# Patient Record
Sex: Male | Born: 1935
Health system: Southern US, Community
[De-identification: ages and names within clinical notes are randomized; demographics above are authoritative.]

## PROBLEM LIST (undated history)

## (undated) DIAGNOSIS — I6529 Occlusion and stenosis of unspecified carotid artery: Secondary | ICD-10-CM

## (undated) DIAGNOSIS — Z8619 Personal history of other infectious and parasitic diseases: Secondary | ICD-10-CM

## (undated) DIAGNOSIS — I679 Cerebrovascular disease, unspecified: Secondary | ICD-10-CM

## (undated) DIAGNOSIS — R7301 Impaired fasting glucose: Secondary | ICD-10-CM

## (undated) DIAGNOSIS — I350 Nonrheumatic aortic (valve) stenosis: Secondary | ICD-10-CM

## (undated) DIAGNOSIS — M109 Gout, unspecified: Secondary | ICD-10-CM

## (undated) DIAGNOSIS — E785 Hyperlipidemia, unspecified: Secondary | ICD-10-CM

## (undated) DIAGNOSIS — I10 Essential (primary) hypertension: Secondary | ICD-10-CM

## (undated) HISTORY — DX: Personal history of other infectious and parasitic diseases: Z86.19

## (undated) HISTORY — DX: Gout, unspecified: M10.9

## (undated) HISTORY — PX: OTHER SURGICAL HISTORY: SHX169

## (undated) HISTORY — DX: Occlusion and stenosis of unspecified carotid artery: I65.29

## (undated) HISTORY — DX: Cerebrovascular disease, unspecified: I67.9

## (undated) HISTORY — DX: Impaired fasting glucose: R73.01

## (undated) HISTORY — DX: Essential (primary) hypertension: I10

## (undated) HISTORY — DX: Nonrheumatic aortic (valve) stenosis: I35.0

## (undated) HISTORY — DX: Hyperlipidemia, unspecified: E78.5

---

## 1998-01-12 ENCOUNTER — Other Ambulatory Visit: Admission: RE | Admit: 1998-01-12 | Discharge: 1998-01-12 | Payer: Self-pay | Admitting: Cardiology

## 1998-01-26 ENCOUNTER — Ambulatory Visit (HOSPITAL_COMMUNITY): Admission: RE | Admit: 1998-01-26 | Discharge: 1998-01-26 | Payer: Self-pay | Admitting: Cardiology

## 1999-10-18 ENCOUNTER — Ambulatory Visit (HOSPITAL_COMMUNITY): Admission: RE | Admit: 1999-10-18 | Discharge: 1999-10-18 | Payer: Self-pay | Admitting: Cardiology

## 2002-01-24 ENCOUNTER — Encounter: Payer: Self-pay | Admitting: *Deleted

## 2002-01-28 ENCOUNTER — Inpatient Hospital Stay (HOSPITAL_COMMUNITY): Admission: RE | Admit: 2002-01-28 | Discharge: 2002-01-29 | Payer: Self-pay | Admitting: *Deleted

## 2002-01-28 ENCOUNTER — Encounter (INDEPENDENT_AMBULATORY_CARE_PROVIDER_SITE_OTHER): Payer: Self-pay | Admitting: *Deleted

## 2007-10-01 ENCOUNTER — Ambulatory Visit: Payer: Self-pay | Admitting: Vascular Surgery

## 2008-01-27 ENCOUNTER — Encounter: Admission: RE | Admit: 2008-01-27 | Discharge: 2008-01-27 | Payer: Self-pay | Admitting: Internal Medicine

## 2009-01-27 ENCOUNTER — Encounter: Admission: RE | Admit: 2009-01-27 | Discharge: 2009-01-27 | Payer: Self-pay | Admitting: Internal Medicine

## 2009-02-11 ENCOUNTER — Encounter: Payer: Self-pay | Admitting: Internal Medicine

## 2010-07-19 ENCOUNTER — Ambulatory Visit: Payer: Self-pay | Admitting: Cardiology

## 2010-09-07 ENCOUNTER — Other Ambulatory Visit: Payer: Self-pay | Admitting: *Deleted

## 2010-09-07 DIAGNOSIS — E785 Hyperlipidemia, unspecified: Secondary | ICD-10-CM

## 2010-09-07 MED ORDER — SIMVASTATIN 20 MG PO TABS: 20.0000 mg | ORAL_TABLET | Freq: Every evening | ORAL | Status: DC

## 2010-10-10 ENCOUNTER — Other Ambulatory Visit: Payer: Self-pay | Admitting: *Deleted

## 2010-10-10 DIAGNOSIS — E7800 Pure hypercholesterolemia, unspecified: Secondary | ICD-10-CM

## 2010-10-18 ENCOUNTER — Other Ambulatory Visit: Payer: Self-pay | Admitting: *Deleted

## 2010-10-27 ENCOUNTER — Other Ambulatory Visit (INDEPENDENT_AMBULATORY_CARE_PROVIDER_SITE_OTHER): Payer: Medicare Other | Admitting: *Deleted

## 2010-10-27 ENCOUNTER — Other Ambulatory Visit (INDEPENDENT_AMBULATORY_CARE_PROVIDER_SITE_OTHER): Payer: Medicare Other | Admitting: Cardiology

## 2010-10-27 DIAGNOSIS — Z13220 Encounter for screening for lipoid disorders: Secondary | ICD-10-CM

## 2010-10-27 DIAGNOSIS — E7800 Pure hypercholesterolemia, unspecified: Secondary | ICD-10-CM

## 2010-10-27 LAB — LIPID PANEL: VLDL: 49.4 mg/dL — ABNORMAL HIGH (ref 0.0–40.0)

## 2010-10-27 LAB — BASIC METABOLIC PANEL
BUN: 16 mg/dL (ref 6–23)
Chloride: 104 mEq/L (ref 96–112)
GFR: 87.45 mL/min (ref >60.00)
Glucose, Bld: 101 mg/dL — ABNORMAL HIGH (ref 70–99)
Potassium: 4.1 mEq/L (ref 3.5–5.1)
Sodium: 141 mEq/L (ref 135–145)

## 2010-10-27 LAB — HEPATIC FUNCTION PANEL
ALT: 19 U/L (ref 0–53)
AST: 17 U/L (ref 0–37)
Alkaline Phosphatase: 79 U/L (ref 39–117)
Total Bilirubin: 0.6 mg/dL (ref 0.3–1.2)

## 2010-10-27 LAB — LDL CHOLESTEROL, DIRECT: Direct LDL: 129.6 mg/dL

## 2010-10-28 ENCOUNTER — Telehealth: Payer: Self-pay | Admitting: *Deleted

## 2010-10-28 NOTE — Telephone Encounter (Signed)
No answering machine available to leave a message regarding lab work

## 2010-11-02 ENCOUNTER — Telehealth: Payer: Self-pay | Admitting: *Deleted

## 2010-11-02 NOTE — Telephone Encounter (Signed)
Message copied by Barnetta Hammersmith on Wed Nov 02, 2010  8:01 AM ------      Message from: Roger Shelter      Created: Fri Oct 28, 2010  3:49 PM       Continue same meds, recheck labs in six months.

## 2010-11-03 NOTE — Telephone Encounter (Signed)
Labs reported to patient. He is inquiring about who he will follow-up with.  Informed pt we will call him back after discussing with Dr Deborah Chalk

## 2010-11-04 ENCOUNTER — Telehealth: Payer: Self-pay | Admitting: *Deleted

## 2010-11-04 NOTE — Op Note (Signed)
NAMEJAYON, [name] NO.:  [id number]   MEDICAL RECORD NO.:  [id number]                   PATIENT TYPE:  INP   LOCATION:  2899                                 FACILITY:  [facility]   PHYSICIAN:  Balinda Quails, M.D.                 DATE OF BIRTH:  Oct 24, 1928   DATE OF PROCEDURE:  01/28/2002  DATE OF DISCHARGE:                                 OPERATIVE REPORT   SURGEON:  Balinda Quails, M.D.   ASSISTANT:  Ines Bloomer, M.D.  Gina L. Thomasena Edis, P.A.   ANESTHETIC:  General endotracheal.   ANESTHESIOLOGIST:  Cliffton Asters. Ivin Booty, M.D.   PREOPERATIVE DIAGNOSIS:  Severe progressive right internal carotid artery  stenosis.   POSTOPERATIVE DIAGNOSES:  1. Severe progressive right internal carotid artery stenosis.  2. Right internal carotid kink.   PROCEDURE:  1. Right carotid endarterectomy, Dacron patch angioplasty.  2. Resection of redundant right internal carotid artery.   CLINICAL NOTE:  This is a 77 year old male who has been followed in the  office with known carotid stenosis.  He recently presented with progression  of disease to greater than 80%.  He has had no symptoms.   It was recommended that the patient undergo right carotid endarterectomy for  reduction of stroke risk.  The patient consented for surgery.  The risks and  benefits of the operative procedure were explained to the patient in detail  including the potential risk of MI, CVA and death.   OPERATIVE PROCEDURE:  The patient was brought to the operating room in  stable condition.  The patient was placed in the supine position.  General  endotracheal anesthesia was induced.  Foley catheter and arterial line were  placed.   The right neck and chest were prepped and draped in a sterile fashion.  A  curvilinear skin incision was made along the anterior border of the right  sternocleidomastoid muscle.  The subcutaneous tissue and platysma were  divided with electrocautery.  Deep  dissection carried along the anterior  border of the sternocleidomastoid to the carotid sheath.  The common carotid  artery was mobilized down to the omohyoid muscle and encircled with a vessel  loop.  The vagus nerve was reflected posteriorly and preserved.  The carotid  bifurcation exposed and the origin of the superior thyroid and external  carotid were encircled with vessel loops.  The internal carotid artery was  then followed distally up to the posterior belly of the digastric muscle.  The hypoglossal nerve was mobilized and retracted superiorly.  The distal  internal carotid artery revealed marked kinking and tortuosity and was  mobilized fully and encircled with a vessel loop.   The carotid bifurcation revealed calcified plaque disease extending into the  origin of the right internal carotid artery.   The patient was administered 7000 units of heparin intravenously.  Adequate  circulation time permitted.  The carotid vessels were controlled with  clamps.  A longitudinal arteriotomy was made in the common carotid artery.  The arteriotomy extended across the carotid bulb and up into the origin of  the right internal carotid artery.  The arteriotomy extended beyond the  plaque disease.  There was a severe stenosis emerging from the right  internal carotid artery greater than 80%.  A straight Bard 8 mm shunt was  inserted into the internal carotid artery distally and then brought down  into the common carotid artery.  This was held in place with keeper turn  keys.  An endarterectomy was then used to raise the plaque.  The  endarterectomy was then carried down into the common carotid artery and the  plaque was divided with transverse Pott scissors.  The plaque then raised up  into the bulb, and the superior thyroid and external carotid were  endarterectomized using an eversion technique.  The internal carotid artery  plaque feathered out well.  The redundant internal carotid artery was  then  resected.  A primary end-to-end anastomosis straightening of the artery was  carried out with running 7-0 Prolene suture.   A patch angioplasty of the endarterectomy site was then carried out with  running 6-0 Prolene suture using a Dacron collagen-coated patch.  At  completion of the patch angioplasty, the shunt was removed.  All vessels  were well-flushed.  Clamps were removed directing initial antegrade flow up  the external carotid artery; following this, the internal carotid was  released.   Excellent pulse and Doppler signal were present in the distal internal  carotid artery.  The patient was administered 50 mg of protamine  intravenously.  Adequate circulation time permitted.   The sternomastoid fascia was then closed with a running 2-0 Vicryl suture.  The platysma was closed with running 3-0 Vicryl suture.  The skin was closed  with 4-0 Monocryl.  A sterile dressing was applied.   Anesthesia was reversed in the operating room.  The patient was awakened  readily, moved all extremities to command and was transferred to the  recovery room in stable condition.                                                 Balinda Quails, M.D.    PGH/MEDQ  D:  01/28/2002  T:  01/30/2002  Job:  [id number]   cc:   Colleen Can. Deborah Chalk, M.D.

## 2010-11-04 NOTE — H&P (Signed)
NAMECURTIES, [name]                            ACCOUNT NO.:  [id number]   MEDICAL RECORD NO.:  [id number]                   PATIENT TYPE:  INP   LOCATION:  2899                                 FACILITY:  [facility]   PHYSICIAN:  Balinda Quails, M.D.                 DATE OF BIRTH:  06/11/1929   DATE OF ADMISSION:  01/28/2002  DATE OF DISCHARGE:                                HISTORY & PHYSICAL   HISTORY OF PRESENT ILLNESS:  The patient is a 77 year old male referred by  Dr. Roger Shelter for evaluation of carotid artery disease.  The patient  has been followed for some time now for asymptomatic, right internal carotid  artery stenosis.  Unfortunately, the latest studies show progression  consistent now with critical right internal carotid artery stenosis.  Dr.  Madilyn Fireman recommends right internal carotid endarterectomy to reduce stroke  risk.  The patient denies any prior history of myocardial infarction,  angina, palpitations, congestive heart failure, peptic ulcer disease,  diabetes, kidney disease, claudication, or seizures.  He has no prior  neurologic symptoms such as unilateral weakness, amaurosis fugax, syncope or  tingling, and he has had no transient ischemic attacks or cerebrovascular  accidents.   PAST MEDICAL HISTORY:  1. Extracranial cerebrovascular occlusive disease.  2. Hypertension.  3. Hypercholesterolemia.   PAST SURGICAL HISTORY:  No prior surgical history.   CURRENT MEDICATIONS:  1. Lotensin 20 mg daily.  2. Lipitor 40 mg daily.  3. One-half baby aspirin daily.  4. Zetia 10 mg daily.  5. Hydrochlorothiazide 25 mg daily.   ALLERGIES:  No known drug allergies.   SOCIAL HISTORY:  The patient is married and has one daughter who is healthy.  He does not partake of alcoholic beverages.  He is a past smoker for 20  years, averaging one-third pack per day.  He quit 33 years ago.  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   PHYSICAL EXAMINATION:  GENERAL:  This  is a Caucasian male in no acute  distress, alert and oriented times three.  VITAL SIGNS:  Blood pressure 128/80 in the right upper extremity, pulse is  80.  HEENT:  Normocephalic and atraumatic.  Eyes, pupils equal, round and  reactive to light.  extraocular movements are intact.  He wears both upper  and lower dentures.  NECK:  Supple and there is no jugulovenous distention.  There is a soft  right carotid bruit.  LUNGS:  Clear to auscultation and percussion bilaterally.  HEART:  Regular rate and rhythm without murmur, rub or gallop.  ABDOMEN:  Soft, nondistended, bowel sounds are present throughout.  No  masses or bruit.  EXTREMITIES:  No evidence of cyanosis, clubbing or edema.  There are no  ulcerations to the lower extremities.  The feet are well perfused.  There  are palpable pedal pulses bilaterally.  NEUROLOGIC:  Examination was  nonfocal.  Gait is steady.  Muscle strength is  5 over 5 bilaterally in both hands for grip.   IMPRESSION:  Right internal carotid artery stenosis, progressive,  asymptomatic.   PLAN:  Right internal carotid endarterectomy, January 28, 2002, Dr. Melford Aase or  Dr. Bud Face.      Maple Mirza, [place]                      Balinda Quails, M.D.    GM/MEDQ  D:  01/24/2002  T:  01/28/2002  Job:  [id number]

## 2010-11-04 NOTE — Telephone Encounter (Signed)
Error

## 2011-01-06 ENCOUNTER — Encounter: Payer: Self-pay | Admitting: Cardiology

## 2011-01-09 ENCOUNTER — Ambulatory Visit: Payer: Medicare Other | Admitting: Nurse Practitioner

## 2011-01-17 ENCOUNTER — Ambulatory Visit (INDEPENDENT_AMBULATORY_CARE_PROVIDER_SITE_OTHER): Payer: Medicare Other | Admitting: Cardiology

## 2011-01-17 ENCOUNTER — Encounter: Payer: Self-pay | Admitting: Cardiology

## 2011-01-17 DIAGNOSIS — I6529 Occlusion and stenosis of unspecified carotid artery: Secondary | ICD-10-CM | POA: Insufficient documentation

## 2011-01-17 DIAGNOSIS — I10 Essential (primary) hypertension: Secondary | ICD-10-CM | POA: Insufficient documentation

## 2011-01-17 DIAGNOSIS — E785 Hyperlipidemia, unspecified: Secondary | ICD-10-CM | POA: Insufficient documentation

## 2011-01-17 DIAGNOSIS — I350 Nonrheumatic aortic (valve) stenosis: Secondary | ICD-10-CM

## 2011-01-17 DIAGNOSIS — I359 Nonrheumatic aortic valve disorder, unspecified: Secondary | ICD-10-CM

## 2011-01-17 LAB — LIPID PANEL
Cholesterol: 182 mg/dL (ref 0–200)
Total CHOL/HDL Ratio: 4
Triglycerides: 320 mg/dL — ABNORMAL HIGH (ref 0.0–149.0)
VLDL: 64 mg/dL — ABNORMAL HIGH (ref 0.0–40.0)

## 2011-01-17 LAB — HEPATIC FUNCTION PANEL
Albumin: 4 g/dL (ref 3.5–5.2)
Total Bilirubin: 0.9 mg/dL (ref 0.3–1.2)

## 2011-01-17 LAB — LDL CHOLESTEROL, DIRECT: Direct LDL: 102.9 mg/dL

## 2011-01-17 NOTE — Assessment & Plan Note (Signed)
Blood pressure is under reasonably good control.  Continue current regimen.  I talked to the patient about exercising more.  He used to walk 1 mile daily and I suggested that he restart this.

## 2011-01-17 NOTE — Progress Notes (Signed)
77 yo with history of HTN, carotid stenosis, cerebrovascular disease, and aortic stenosis presents for cardiology followup.  Patient has been seen by Dr. Deborah Chalk in the past and is seen by me for the first time today.  He had a right CEA in 2003.  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  Stress myoview in 2010 was normal.    Symptomatically, he has been stable.  He can walk on flat ground and up hills without significant dyspnea.  He is mildly short of breath when he push mows on an incline.  No chest pain.  Last lipids showed elevated LDL but he was not taking simvastatin regularly.  He has started taking his statin regularly.  BP is under reasonable control.   ECG: NSR, normal  Labs (5/12): LDL 130, HDL 41, TGs 247, K 4.1, creatinine 0.9, LFTs normal  PMH: 1. Gout 2. HTN 3. Hyperlipidemia 4. Carotid stenosis: right CEA in 2003. 5. Cerebrovascular disease: Occluded basilar artery noted around 2009.  Patient had diplopia as presenting symptom (resolved).  6. Stress myoview in 2010 was normal.  7. Impaired fasting glucose 8. Aortic stenosis: Mild to moderate by echo in 12/10.  9. 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  Retired Nutritional therapist.    FH: Father with MI at 67, mother with skin cancer.   ROS: All systems reviewed and negative except as per HPI.   Current Outpatient Prescriptions  Medication Sig Dispense Refill  . amLODipine (NORVASC) 5 MG tablet Take 5 mg by mouth daily.        Marland Kitchen aspirin 162 MG EC tablet Take 162 mg by mouth daily.        . benazepril (LOTENSIN) 20 MG tablet Take 20 mg by mouth daily.        . hydrochlorothiazide 25 MG tablet Take 25 mg by mouth daily.        . metoprolol (TOPROL-XL) 50 MG 24 hr tablet Take 50 mg by mouth daily.        . simvastatin (ZOCOR) 20 MG tablet Take 1 tablet (20 mg total) by mouth every evening.  30 tablet  6    BP 134/75  Pulse 72   Resp 14  Ht 6' (1.829 m)  Wt 199 lb (90.266 kg)  BMI 26.99 kg/m2 General: NAD Neck: No JVD, no thyromegaly or thyroid nodule.  Lungs: Clear to auscultation bilaterally with normal respiratory effort. CV: Nondisplaced PMI.  Heart regular S1/S2, no S3/S4, 1/6 SEM.  Trace ankle edema.  No carotid bruit.  Normal pedal pulses.  Abdomen: Soft, nontender, no hepatosplenomegaly, no distention.  Neurologic: Alert and oriented x 3.  Psych: Normal affect. Extremities: No clubbing or cyanosis.

## 2011-01-17 NOTE — Assessment & Plan Note (Signed)
Mild to moderate aortic stenosis by echo in 2010.  Will repeat echo to assess for progression.

## 2011-01-17 NOTE — Patient Instructions (Signed)
Lab today--lipid profile/liver profile 424.1  433.1  Schedule an appointment for a carotid doppler. 433.1  Schedule an appointment for an echocardiogram 424.1  Your physician wants you to follow-up in: 6 months with Dr Shirlee Latch. (January 2013) You will receive a reminder letter in the mail two months in advance. If you don't receive a letter, please call our office to schedule the follow-up appointment.

## 2011-01-17 NOTE — Assessment & Plan Note (Signed)
History of right CEA.  Needs followup carotid dopplers.

## 2011-01-17 NOTE — Assessment & Plan Note (Signed)
Goal LDL < 70 with known vascular disease.  LDL was too high in 5/12.  Since then, patient has been taking simvastatin more regularly.  Will repeat lipids/LFTs today.

## 2011-01-24 ENCOUNTER — Telehealth: Payer: Self-pay | Admitting: *Deleted

## 2011-01-24 DIAGNOSIS — I10 Essential (primary) hypertension: Secondary | ICD-10-CM

## 2011-01-24 DIAGNOSIS — I6529 Occlusion and stenosis of unspecified carotid artery: Secondary | ICD-10-CM

## 2011-01-24 DIAGNOSIS — E785 Hyperlipidemia, unspecified: Secondary | ICD-10-CM

## 2011-01-24 MED ORDER — OMEGA-3-ACID ETHYL ESTERS 1 G PO CAPS: 2.0000 g | ORAL_CAPSULE | Freq: Two times a day (BID) | ORAL | Status: DC

## 2011-01-24 MED ORDER — ATORVASTATIN CALCIUM 40 MG PO TABS: 40.0000 mg | ORAL_TABLET | Freq: Every day | ORAL | Status: DC

## 2011-01-24 NOTE — Telephone Encounter (Signed)
Notes Recorded by Jacqlyn Krauss, RN on 01/24/2011 at 2:53 PM I talked with pt. Pt agreed to stop Simvastatin and start Atorvastatin 40mg  daily. He will also start Lovaza 2g bid. He will return for fasting lipid/liver profile 03/29/11. ------  Notes Recorded by Marca Ancona, MD on 01/22/2011 at 11:34 PM TGs very high and LDL still well above goal. Cannot increase simvastatin with amlodipine use. Would change to atorvastatin 40 mg daily. Would also consider prescription-strength fish oil for triglycerides (Lovaza 2 g bid).

## 2011-01-27 ENCOUNTER — Encounter (INDEPENDENT_AMBULATORY_CARE_PROVIDER_SITE_OTHER): Payer: Medicare Other | Admitting: *Deleted

## 2011-01-27 ENCOUNTER — Ambulatory Visit (HOSPITAL_COMMUNITY): Payer: Medicare Other | Attending: Cardiology | Admitting: Radiology

## 2011-01-27 DIAGNOSIS — I679 Cerebrovascular disease, unspecified: Secondary | ICD-10-CM | POA: Insufficient documentation

## 2011-01-27 DIAGNOSIS — I10 Essential (primary) hypertension: Secondary | ICD-10-CM | POA: Insufficient documentation

## 2011-01-27 DIAGNOSIS — I059 Rheumatic mitral valve disease, unspecified: Secondary | ICD-10-CM | POA: Insufficient documentation

## 2011-01-27 DIAGNOSIS — E785 Hyperlipidemia, unspecified: Secondary | ICD-10-CM | POA: Insufficient documentation

## 2011-01-27 DIAGNOSIS — I079 Rheumatic tricuspid valve disease, unspecified: Secondary | ICD-10-CM | POA: Insufficient documentation

## 2011-01-27 DIAGNOSIS — Z87891 Personal history of nicotine dependence: Secondary | ICD-10-CM | POA: Insufficient documentation

## 2011-01-27 DIAGNOSIS — I359 Nonrheumatic aortic valve disorder, unspecified: Secondary | ICD-10-CM | POA: Insufficient documentation

## 2011-01-27 DIAGNOSIS — I350 Nonrheumatic aortic (valve) stenosis: Secondary | ICD-10-CM

## 2011-01-27 DIAGNOSIS — I6529 Occlusion and stenosis of unspecified carotid artery: Secondary | ICD-10-CM

## 2011-01-27 DIAGNOSIS — I0989 Other specified rheumatic heart diseases: Secondary | ICD-10-CM | POA: Insufficient documentation

## 2011-01-30 ENCOUNTER — Encounter: Payer: Self-pay | Admitting: Cardiovascular Disease

## 2011-01-31 ENCOUNTER — Telehealth: Payer: Self-pay | Admitting: *Deleted

## 2011-01-31 NOTE — Telephone Encounter (Signed)
Calling to give pt results of carotids and echo--no answer, no voice mail--nt

## 2011-02-03 NOTE — Telephone Encounter (Signed)
PT  AWARE OF  TEST  RESULTS  ./CY 

## 2011-03-29 ENCOUNTER — Other Ambulatory Visit: Payer: Self-pay | Admitting: Cardiology

## 2011-03-29 ENCOUNTER — Other Ambulatory Visit (INDEPENDENT_AMBULATORY_CARE_PROVIDER_SITE_OTHER): Payer: Medicare Other | Admitting: *Deleted

## 2011-03-29 DIAGNOSIS — I10 Essential (primary) hypertension: Secondary | ICD-10-CM

## 2011-03-29 DIAGNOSIS — I6529 Occlusion and stenosis of unspecified carotid artery: Secondary | ICD-10-CM

## 2011-03-29 DIAGNOSIS — E785 Hyperlipidemia, unspecified: Secondary | ICD-10-CM

## 2011-03-29 LAB — LIPID PANEL
Cholesterol: 229 mg/dL — ABNORMAL HIGH (ref 0–200)
HDL: 40.5 mg/dL (ref >39.00)
Triglycerides: 216 mg/dL — ABNORMAL HIGH (ref 0.0–149.0)
VLDL: 43.2 mg/dL — ABNORMAL HIGH (ref 0.0–40.0)

## 2011-03-29 LAB — HEPATIC FUNCTION PANEL
ALT: 14 U/L (ref 0–53)
Total Bilirubin: 0.5 mg/dL (ref 0.3–1.2)

## 2011-03-29 LAB — LDL CHOLESTEROL, DIRECT: Direct LDL: 163.4 mg/dL

## 2011-03-31 ENCOUNTER — Ambulatory Visit (INDEPENDENT_AMBULATORY_CARE_PROVIDER_SITE_OTHER): Payer: Medicare Other | Admitting: Pulmonary Disease

## 2011-03-31 ENCOUNTER — Encounter: Payer: Self-pay | Admitting: Pulmonary Disease

## 2011-03-31 VITALS — BP 160/72 | HR 84 | Temp 97.9°F | Ht 72.0 in | Wt 195.8 lb

## 2011-03-31 DIAGNOSIS — J984 Other disorders of lung: Secondary | ICD-10-CM

## 2011-03-31 DIAGNOSIS — R911 Solitary pulmonary nodule: Secondary | ICD-10-CM

## 2011-03-31 NOTE — Patient Instructions (Signed)
Will check ct chest in 4mos.  Will call you with results.

## 2011-03-31 NOTE — Progress Notes (Signed)
  Subjective:    Patient ID: [name], male    DOB: 01/25/1929, 77 y.o.   MRN: [id number]  HPI The patient is a very pleasant 77 year old male who I've been asked to see for an abnormal CT chest.  He recently had a fall approximately 3 weeks ago, where he struck the right side of his chest.  There was something noted on his chest x-ray, and therefore underwent CT scan of his chest.  This showed a very small right effusion with adjacent atelectasis, minimal pleural calcifications, and unexpectedly a 1.3 cm irregular density in the right upper lobe.  The patient does have a 25 year history of smoking, but has not done so since 1972.  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  The patient has never lived in [place] or [place], and has no history of known TB exposure.  He tells me that he has had a negative PPD in the distant past.  He denies any cough, congestion, or hemoptysis.  He is eating well, and his weight has been stable.   Review of Systems  Constitutional: Negative for fever and unexpected weight change.  HENT: Negative for ear pain, nosebleeds, congestion, sore throat, rhinorrhea, sneezing, trouble swallowing, dental problem, postnasal drip and sinus pressure.   Eyes: Negative for redness and itching.  Respiratory: Negative for cough, chest tightness, shortness of breath and wheezing.   Cardiovascular: Negative for palpitations and leg swelling.  Gastrointestinal: Negative for nausea and vomiting.  Genitourinary: Negative for dysuria.  Musculoskeletal: Negative for joint swelling.  Skin: Negative for rash.  Neurological: Negative for headaches.  Hematological: Does not bruise/bleed easily.  Psychiatric/Behavioral: Negative for dysphoric mood. The patient is not nervous/anxious.        Objective:   Physical Exam Constitutional:  Well developed, no acute distress  HENT:  Nares patent without discharge  Oropharynx without exudate,  palate and uvula are normal  Eyes:  Right pupil greater than left, eomi, no scleral icterus  Neck:  No JVD, no TMG  Cardiovascular:  Normal rate, regular rhythm, no rubs or gallops. 2/6 sem        Intact distal pulses  Pulmonary :  Normal breath sounds, no stridor or respiratory distress   No rales, rhonchi, or wheezing  Abdominal:  Soft, nondistended, bowel sounds present.  No tenderness noted.   Musculoskeletal:  No lower extremity edema noted of significance.  Lymph Nodes:  No cervical lymphadenopathy noted  Skin:  No cyanosis noted  Neurologic:  Alert, appropriate, moves all 4 extremities without obvious deficit.        Assessment & Plan:

## 2011-04-06 ENCOUNTER — Telehealth: Payer: Self-pay | Admitting: Cardiology

## 2011-04-06 DIAGNOSIS — E785 Hyperlipidemia, unspecified: Secondary | ICD-10-CM

## 2011-04-06 DIAGNOSIS — I059 Rheumatic mitral valve disease, unspecified: Secondary | ICD-10-CM

## 2011-04-06 MED ORDER — ATORVASTATIN CALCIUM 40 MG PO TABS: 40.0000 mg | ORAL_TABLET | Freq: Every day | ORAL | Status: DC

## 2011-04-06 NOTE — Telephone Encounter (Signed)
Notes Recorded by Jacqlyn Krauss, RN on 04/06/2011 at 6:55 PM I reviewed with Dr Shirlee Latch and he agreed with this plan. I have also discussed with pt and he verbalized understanding. Notes Recorded by Jacqlyn Krauss, RN on 04/06/2011 at 11:06 AM I talked with pt's daughter, Mardene Celeste. Pt never filled atorvastatin the first of August and has not been taking any statin. Mardene Celeste talked with pt and he is willing to try atorvastatin 40mg . We discussed taking coenzyme Q10 200mg  daily. He will return for fasting lipid/liver profile 06/08/11. Notes Recorded by Jacqlyn Krauss, RN on 04/06/2011 at 10:04 AM It looks like pt was changed to atorvastatin from simvastatin the first August. I talked with pt's daughter, Mardene Celeste. She states she does not think pt has been taking atorvastatin or any cholesterol medication. She states pt had problems taking statins in the past due to leg aches and pain. She will talk with pt and confirm that pt is not on any statin at the present. If he isn't taking a statin now, she will try to find out why. She will call me back once she talks with pt. Notes Recorded by Jacqlyn Krauss, RN on 04/05/2011 at 3:46 PM NA Notes Recorded by Jacqlyn Krauss, RN on 04/05/2011 at 1:08 PM NA Notes Recorded by Marca Ancona, MD on 03/31/2011 at 9:23 AM LDL still very high, would stop simvastatin and start atorvastatin 40 mg daily.

## 2011-04-06 NOTE — Telephone Encounter (Signed)
Pr returning your call

## 2011-06-08 ENCOUNTER — Other Ambulatory Visit (INDEPENDENT_AMBULATORY_CARE_PROVIDER_SITE_OTHER): Payer: Medicare Other | Admitting: *Deleted

## 2011-06-08 DIAGNOSIS — I059 Rheumatic mitral valve disease, unspecified: Secondary | ICD-10-CM

## 2011-06-08 DIAGNOSIS — E785 Hyperlipidemia, unspecified: Secondary | ICD-10-CM

## 2011-06-08 LAB — HEPATIC FUNCTION PANEL
AST: 21 U/L (ref 0–37)
Alkaline Phosphatase: 85 U/L (ref 39–117)
Total Bilirubin: 0.6 mg/dL (ref 0.3–1.2)

## 2011-06-08 LAB — LIPID PANEL
Cholesterol: 187 mg/dL (ref 0–200)
HDL: 40.7 mg/dL (ref >39.00)
LDL Cholesterol: 119 mg/dL — ABNORMAL HIGH (ref 0–99)
VLDL: 27.8 mg/dL (ref 0.0–40.0)

## 2011-06-21 ENCOUNTER — Telehealth: Payer: Self-pay | Admitting: Cardiology

## 2011-06-21 NOTE — Telephone Encounter (Signed)
FU Call: Pt returning call from our office regarding pt lab results. Please return pt call to discuss further.

## 2011-06-26 NOTE — Telephone Encounter (Signed)
Already spoke to patient 06/16/11 about labs,patient unable to take simvastatin,atorvastatin,causes muscle soreness.Already fowarded to Dr.Mclean for advice.

## 2011-06-26 NOTE — Telephone Encounter (Signed)
I talked with pt. Pt states he has taken lovastatin, simvastatin, atorvastatin, and  crestor . Pt states he has muscle aches from all of them. Dr Shirlee Latch recommended pt try niacin 500mg  hs for 3-4 weeks and if he tolerates that increase to niacin 1000mg  hs.

## 2011-06-26 NOTE — Telephone Encounter (Signed)
I talked with pt. Pt states he has tried niaspan in the past and has been unable to tolerate it. Dr Shirlee Latch is aware.

## 2011-06-26 NOTE — Telephone Encounter (Signed)
Patient called he is not taking a statin.He takes lovaza 1 Gm daily.

## 2011-06-26 NOTE — Telephone Encounter (Signed)
Is he taking any statin now?

## 2011-06-28 ENCOUNTER — Telehealth: Payer: Self-pay

## 2011-06-28 MED ORDER — COQ10 200 MG PO CAPS: ORAL_CAPSULE | ORAL | Status: DC

## 2011-06-28 MED ORDER — PRAVASTATIN SODIUM 40 MG PO TABS: 40.0000 mg | ORAL_TABLET | Freq: Every evening | ORAL | Status: DC

## 2011-06-28 NOTE — Telephone Encounter (Signed)
Patient was called and told Dr.Mclean advised to try taking Pravastatin 40 mg at night,with CoQ10 200 mg at night.Repeat lipids/liver in 2 months.

## 2011-07-07 DIAGNOSIS — R911 Solitary pulmonary nodule: Secondary | ICD-10-CM | POA: Diagnosis not present

## 2011-07-07 DIAGNOSIS — R222 Localized swelling, mass and lump, trunk: Secondary | ICD-10-CM | POA: Diagnosis not present

## 2011-07-10 ENCOUNTER — Telehealth: Payer: Self-pay | Admitting: Pulmonary Disease

## 2011-07-10 NOTE — Telephone Encounter (Signed)
Received copies from [facility] 1.21.13 . Forwarded 2 pages to Dr. Shelle Iron ,for review. sj

## 2011-07-19 ENCOUNTER — Telehealth: Payer: Self-pay | Admitting: Pulmonary Disease

## 2011-07-19 DIAGNOSIS — R911 Solitary pulmonary nodule: Secondary | ICD-10-CM

## 2011-07-19 IMAGING — CT CT ANGIO HEAD
1 of 10 series · 1 of 14 positions shown · IV contrast ([ID] OMNI 350)
Comparison: Report of the carotid duplex ultrasound dated
01/21/2009 (no images available).   .

CTA NECK

CLINICAL DATA: 80-year-old male with abnormal vertebral artery flow
is seen on ultrasound suggesting occlusion or hypoplasia.  The
patient has been off balance.

CT ANGIOGRAPHY HEAD AND NECK
TECHNIQUE: Multidetector CT imaging of the head and neck was
performed using the standard protocol during bolus administration
of intravenous contrast. Multiplanar CT image reconstructions
including MIPs were obtained to evaluate the vascular anatomy.
Carotid stenosis measurements (when applicable) are obtained
utilizing NASCET criteria, using the distal internal carotid
diameter as the denominator.
Contrast:  100 ml Omnipaque 350.

[Series 2: head/neck w/out cm · axial · 0.43mm/px · 1 of 71 slices shown]
[im 1/71  soft-tissue]
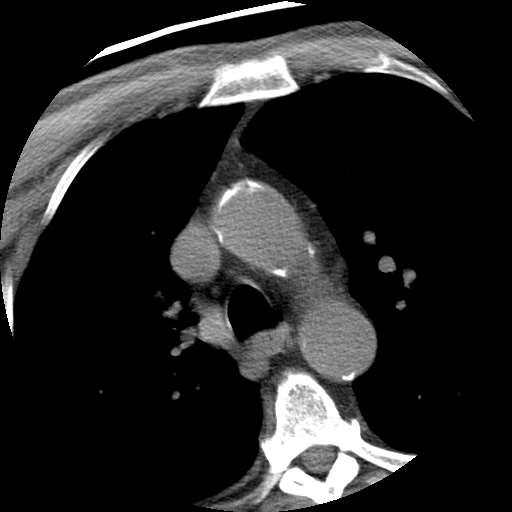

[1 of 14 positions shown; findings below may reference images not displayed]

FINDINGS: There is right apical lung scarring.  There is a the 10 x
9 mm confluent opacity at this scarring which tracks towards the
right hilum.  There is mild bronchiectasis.  There is mild
paraseptal emphysema seen in the upper lobes bilaterally.  No
mediastinal lymphadenopathy.

Degenerative changes in the cervical spine. No acute osseous
abnormality identified.  Postoperative changes in the right carotid
space. No lymphadenopathy.  Pharyngeal mucosal spaces and the
thyroid are within normal limits.  Parapharyngeal, retropharyngeal
and sublingual spaces are within normal limits.  Submandibular and
parotid glands are within normal limits.

Vascular Findings: Three-vessel arch configuration.  Bilateral
common carotid artery origins are within normal limits.

Soft and calcified plaque at the right vertebral artery origin
subsequent high-grade stenosis.  However, there is some enhancement
of the right V1 segment.  A second level of high-grade stenosis is
seen 2 cm from the right vertebral artery origin proximal to the
entry into the right transverse foramen.  The right V2 segment
demonstrates better enhancement but remains irregular.  There is
calcified atherosclerosis in the distal right V3 segment resulting
in stenosis 7-50%.  The right V4 segment also irregular.  Right
PICA is patent, but the right vertebral artery beyond the right
PICA is extremely diminutive.

The left vertebral artery is occluded just beyond its origin
(series 5 image 33), and is reconstituted from muscular branches at
the C4-C5 level (series 401 image 76).  There is poor flow in the
left vertebral artery to the C3 level, beyond which flow is
improved.  The V3 and proximal V4 segments are within normal
limits.  There is irregularity of the distal V4 segment which is
extremely diminutive beyond the origin of PICA as on the right.

Right common carotid artery is within normal limits to the right
carotid bifurcation.  Postoperative changes are seen at the
bifurcation status post endarterectomy.  The right ICA origin is
widely patent.  There is mild to moderate residual irregularity at
the distal right ICA bulb resulting in 30 % stenosis with respect
to the distal vessel.  Beyond this level, the cervical right ICA is
normal.

The left common carotid artery is within normal limits to the
bifurcation.  At the left carotid bifurcation, abundant soft plaque
is seen at the left ICA origin and extending into the posterior
bulb.  Stenosis of 55 - 65 % with respect to the distal
vesselresults (series 402 image 116 aside from tortuosity beyond
this level of the cervical left ICA is normal.

Review of the MIP images confirms the above findings.
IMPRESSION: 1.  Right apical pulmonary scarring with 9 x 10 mm nodular area.
Scar carcinoma not excluded.  Recommend correlation with previous
chest CT, or failing that follow-up PET-CT versus 3-month follow-up
chest CT. This recommendation follows the consensus statement:
"Guidelines for Management of Small Pulmonary Nodules Detected on
CT Scans:  A Statement from the [HOSPITAL]" as published in
[URL]
2. Left vertebral artery is occluded at its origin and
reconstituted at the C4-C5 level.  Tandem high-grade stenoses of
the proximal right vertebral artery.  Both vertebral arteries are
patent at the skull base, but are extremely diminutive beyond the
origins of PICA.
3.  Extensive soft plaque involving the left ICA origin and bulb
resulting in stenosis at 55 - 65 % with respect to the distal
vessel.
4.  Sequelae of right carotid endarterectomy with no
hemodynamically significant stenosis.
5.  See intracranial CTA below.

CTA HEAD
FINDINGS: Visualized orbits and scalp soft tissues are within
normal limits.  Visualized paranasal sinuses and mastoids are
clear.  No acute osseous abnormality identified.  Frontal and
parietal lobe cerebral volume loss appears advanced for age.
Ventricular size and configuration are within normal limits.  No
midline shift, mass effect, or evidence of mass lesion.  No acute
intracranial hemorrhage identified.  Gray-white matter
differentiation is within normal limits throughout the brain.  No
evidence of cortically based acute infarction identified.  No
abnormal enhancement identified.

Vascular Findings: Major intracranial venous structures are
enhancing.

Diminutive bilateral distal vertebral arteries beyond the origins
of PICA.  Moderate to severe irregularity of the basilar artery
compatible with atherosclerosis with tandem stenoses, including a
high-grade stenosis just proximal to the superior cerebellar artery
origins (series 601 image 20).  Bilateral SCA vessels remain
patent.  There is a fetal type origin of the left PCA.  There is a
small right posterior communicating artery.  The right PCA P1
segment is also diminutive.  There is preserved bilateral PCA flow,
and other PCA branches appear within normal limits.

Both ICA siphons are patent.  There is fusiform enlargement of the
right cavernous ICA segment measuring up to 6 - 8 mm in diameter.
There is mild ICA calcified atherosclerosis with no significant
stenosis.  Bilateral posterior communicating artery origins are
within normal limits.  Carotid termini are within normal limits.
The left ACA A1 segment is hypoplastic.  The left A2 segment is
supplied from the right.  The anterior communicating artery and
other ACA branches are within normal limits.  Bilateral MCA origins
are within normal limits.  Bilateral MCA branches are within normal
limits.

 Review of the MIP images confirms the above findings.
IMPRESSION: 1.  Severe basilar artery atherosclerosis with high-grade distal
basilar artery stenosis.  Preserved flow in the PCA circulation.
2.  Fusiform (aneurysmal) enlargement of the cavernous right ICA
segment measuring 6-8 mm in diameter.  No intracranial ICA
stenosis.
3.  Otherwise negative anterior circulation.
4. No acute intracranial abnormality.  Bilateral frontal and
parietal lobe atrophy appears advanced.

## 2011-07-19 NOTE — Telephone Encounter (Signed)
Pt's daughter called Nicholos Johns at the same time & Stated that the CT was done 1/18 at [facility] imaging.  Daughter's name is Mardene Celeste & can be reached at [phone].  Antionette Fairy

## 2011-07-19 NOTE — Telephone Encounter (Signed)
Discussed ct results with pt, and recommended proceeding with PET scan.  The pt wished to take a more conservative approach, and wished to continue surveillance.  He understands this may be a cancer.  He still wishes to avoid aggressive workup and treatment (sites his age).  Will therefore do f/u ct without contrast in July.  I discussed 4mos but he prefers 6.

## 2011-07-19 NOTE — Telephone Encounter (Signed)
Never received dictated report from [facility].  Now that I know ct has been done, have reviewed.  The density in RUL is unchanged in size, but does appear denser.  The rest of the findings are either unchanged or minimally enlarged.   Will discuss with pt  Proceeding with PET scan.

## 2011-07-19 NOTE — Telephone Encounter (Signed)
I spoke with pt and he is requesting his CT results from 07/07/11 done at [facility]. Pt states these were suppose to be sent to him. Please advise Dr. Shelle Iron, thanks

## 2011-07-24 ENCOUNTER — Encounter: Payer: Self-pay | Admitting: Pulmonary Disease

## 2011-07-24 ENCOUNTER — Ambulatory Visit: Payer: Medicare Other | Admitting: Cardiology

## 2011-08-11 ENCOUNTER — Encounter: Payer: Self-pay | Admitting: Cardiology

## 2011-08-11 ENCOUNTER — Ambulatory Visit (INDEPENDENT_AMBULATORY_CARE_PROVIDER_SITE_OTHER): Payer: Medicare Other | Admitting: Cardiology

## 2011-08-11 VITALS — BP 130/76 | HR 91 | Ht 72.0 in | Wt 200.0 lb

## 2011-08-11 DIAGNOSIS — I6529 Occlusion and stenosis of unspecified carotid artery: Secondary | ICD-10-CM

## 2011-08-11 DIAGNOSIS — I359 Nonrheumatic aortic valve disorder, unspecified: Secondary | ICD-10-CM

## 2011-08-11 DIAGNOSIS — I10 Essential (primary) hypertension: Secondary | ICD-10-CM | POA: Diagnosis not present

## 2011-08-11 DIAGNOSIS — I350 Nonrheumatic aortic (valve) stenosis: Secondary | ICD-10-CM

## 2011-08-11 DIAGNOSIS — E785 Hyperlipidemia, unspecified: Secondary | ICD-10-CM

## 2011-08-11 MED ORDER — PRAVASTATIN SODIUM 80 MG PO TABS: 80.0000 mg | ORAL_TABLET | Freq: Every evening | ORAL | Status: DC

## 2011-08-11 NOTE — Patient Instructions (Signed)
Your physician has recommended you make the following change in your medication: increase pravastatin  Your physician recommends that you return for lab work in: 2 months for fasting lipids and lfts  Your physician wants you to follow-up in: 6 months with Dr. Shirlee Latch. You will receive a reminder letter in the mail two months in advance. If you don't receive a letter, please call our office to schedule the follow-up appointment.

## 2011-08-13 NOTE — Progress Notes (Signed)
PCP: Dr. Clelia Croft  78 yo with history of HTN, carotid stenosis, cerebrovascular disease, and aortic stenosis presents for cardiology followup.  He had a right CEA in 2003.  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  Stress myoview in 2010 was normal.  Echo in 8/12 showed mild aortic stenosis.   Symptomatically, he has been stable.  He can walk on flat ground and up hills without significant dyspnea.  He is mildly short of breath when he does heavy work.  BP is under reasonable control.  He has had myalgias with atorvastatin and simvastatin and is now on pravastatin.   Labs (5/12): LDL 130, HDL 41, TGs 247, K 4.1, creatinine 0.9, LFTs normal Labs (12/12): LDL 119, HDL 41  PMH: 1. Gout 2. HTN 3. Hyperlipidemia: myalgias with simvastatin and atorvastatin.  4. Carotid stenosis: right CEA in 2003.  Carotid dopplers (8/12) with 40-59% LICA stenosis, s/p R CEA.   5. Cerebrovascular disease: Occluded basilar artery noted around 2009.  Patient had diplopia as presenting symptom (resolved).  6. Stress myoview in 2010 was normal.  7. Impaired fasting glucose 8. Aortic stenosis: Mild to moderate by echo in 12/10.  Echo (8/12) with EF 65-70%, mild AS with mean gradient 14 mmHg.  9. 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  Retired Nutritional therapist.    FH: Father with MI at 45, mother with skin cancer.    Current Outpatient Prescriptions  Medication Sig Dispense Refill  . amLODipine (NORVASC) 5 MG tablet Take 5 mg by mouth daily.        Marland Kitchen aspirin 81 MG tablet Take 81 mg by mouth daily.      . benazepril (LOTENSIN) 20 MG tablet Take 20 mg by mouth daily.        . Coenzyme Q10 (COQ10) 200 MG CAPS Take 200 mg every night  30 capsule  11  . hydrochlorothiazide 25 MG tablet Take 25 mg by mouth daily.        . metoprolol (TOPROL-XL) 50 MG 24 hr tablet Take 50 mg by mouth daily.        Marland Kitchen omega-3 acid ethyl esters  (LOVAZA) 1 G capsule Take 2 capsules (2 g total) by mouth 2 (two) times daily.  360 capsule  3  . pravastatin (PRAVACHOL) 80 MG tablet Take 1 tablet (80 mg total) by mouth every evening.  30 tablet  11    BP 130/76  Pulse 91  Ht 6' (1.829 m)  Wt 200 lb (90.719 kg)  BMI 27.12 kg/m2 General: NAD Neck: No JVD, no thyromegaly or thyroid nodule.  Lungs: Clear to auscultation bilaterally with normal respiratory effort. CV: Nondisplaced PMI.  Heart regular S1/S2, no S3/S4, 1/6 SEM.  Trace ankle edema.  Left carotid bruit.  Normal pedal pulses.  Abdomen: Soft, nontender, no hepatosplenomegaly, no distention.  Neurologic: Alert and oriented x 3.  Psych: Normal affect. Extremities: No clubbing or cyanosis.

## 2011-08-13 NOTE — Assessment & Plan Note (Signed)
Repeat carotid dopplers in 8/13 to followup on LICA stenosis.

## 2011-08-13 NOTE — Assessment & Plan Note (Signed)
Goal LDL < 70 with known vascular disease.  I will increase pravastatin to 80 mg daily and check lipids/LFTs in 2 months.

## 2011-08-13 NOTE — Assessment & Plan Note (Signed)
BP is under good control.  

## 2011-08-13 NOTE — Assessment & Plan Note (Signed)
Mild AS.  Would followup with an echo in 2-3 years.

## 2011-08-17 DIAGNOSIS — S32009A Unspecified fracture of unspecified lumbar vertebra, initial encounter for closed fracture: Secondary | ICD-10-CM | POA: Diagnosis not present

## 2011-08-31 DIAGNOSIS — M25559 Pain in unspecified hip: Secondary | ICD-10-CM | POA: Diagnosis not present

## 2011-09-04 ENCOUNTER — Other Ambulatory Visit: Payer: Self-pay | Admitting: Cardiology

## 2011-09-04 MED ORDER — AMLODIPINE BESYLATE 5 MG PO TABS: 5.0000 mg | ORAL_TABLET | Freq: Every day | ORAL | Status: DC

## 2011-09-04 MED ORDER — HYDROCHLOROTHIAZIDE 25 MG PO TABS: 25.0000 mg | ORAL_TABLET | Freq: Every day | ORAL | Status: DC

## 2011-09-04 MED ORDER — BENAZEPRIL HCL 20 MG PO TABS: 20.0000 mg | ORAL_TABLET | Freq: Every day | ORAL | Status: DC

## 2011-09-04 MED ORDER — METOPROLOL SUCCINATE ER 50 MG PO TB24: 50.0000 mg | ORAL_TABLET | Freq: Every day | ORAL | Status: DC

## 2011-10-10 ENCOUNTER — Other Ambulatory Visit (INDEPENDENT_AMBULATORY_CARE_PROVIDER_SITE_OTHER): Payer: Medicare Other

## 2011-10-10 DIAGNOSIS — E785 Hyperlipidemia, unspecified: Secondary | ICD-10-CM

## 2011-10-10 LAB — HEPATIC FUNCTION PANEL
ALT: 21 U/L (ref 0–53)
AST: 21 U/L (ref 0–37)
Alkaline Phosphatase: 73 U/L (ref 39–117)
Bilirubin, Direct: 0.1 mg/dL (ref 0.0–0.3)
Total Bilirubin: 0.4 mg/dL (ref 0.3–1.2)
Total Protein: 6.7 g/dL (ref 6.0–8.3)

## 2011-10-10 LAB — LIPID PANEL: HDL: 39.4 mg/dL (ref >39.00)

## 2011-11-09 ENCOUNTER — Encounter: Payer: Self-pay | Admitting: *Deleted

## 2012-03-12 ENCOUNTER — Encounter (INDEPENDENT_AMBULATORY_CARE_PROVIDER_SITE_OTHER): Payer: Medicare Other

## 2012-03-12 ENCOUNTER — Encounter: Payer: Self-pay | Admitting: Cardiology

## 2012-03-12 ENCOUNTER — Ambulatory Visit (INDEPENDENT_AMBULATORY_CARE_PROVIDER_SITE_OTHER): Payer: Medicare Other | Admitting: Cardiology

## 2012-03-12 VITALS — BP 130/70 | HR 59 | Ht 72.0 in | Wt 197.4 lb

## 2012-03-12 DIAGNOSIS — I6529 Occlusion and stenosis of unspecified carotid artery: Secondary | ICD-10-CM

## 2012-03-12 DIAGNOSIS — I359 Nonrheumatic aortic valve disorder, unspecified: Secondary | ICD-10-CM

## 2012-03-12 DIAGNOSIS — E785 Hyperlipidemia, unspecified: Secondary | ICD-10-CM | POA: Diagnosis not present

## 2012-03-12 DIAGNOSIS — I350 Nonrheumatic aortic (valve) stenosis: Secondary | ICD-10-CM

## 2012-03-12 DIAGNOSIS — I10 Essential (primary) hypertension: Secondary | ICD-10-CM

## 2012-03-12 MED ORDER — NIACIN ER (ANTIHYPERLIPIDEMIC) 500 MG PO TBCR: EXTENDED_RELEASE_TABLET | ORAL | Status: DC

## 2012-03-12 NOTE — Patient Instructions (Addendum)
Start Niaspan (niacin) 500mg  daily at bedtime. If you tolerate one at bedtime after 1 week increase Niaspan to 1000mg  daily at bedtime. This will be one 500mg  Nisapan at bedtime for 1 week, then two 500mg  niaspan at bedtime.   Your physician recommends that you return for a FASTING lipid profile /liver profile/CBCd/BMET in 2 months.   Your physician wants you to follow-up in: 6 months with Dr Shirlee Latch. [facility] 2014). You will receive a reminder letter in the mail two months in advance. If you don't receive a letter, please call our office to schedule the follow-up appointment.

## 2012-03-12 NOTE — Progress Notes (Signed)
Patient ID: [name], male   DOB: Oct 26, 1928, 78 y.o.   MRN: [id number] PCP: Dr. Clelia Croft  77 yo with history of HTN, carotid stenosis, cerebrovascular disease, and aortic stenosis presents for cardiology followup.  He had a right CEA in 2003.  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  Stress myoview in 2010 was normal.  Echo in 8/12 showed mild aortic stenosis.   Symptomatically, he has been stable.  He can walk on flat ground and up hills without significant dyspnea.  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  No chest pain. He is mildly short of breath when he does heavy work.  BP is under reasonable control.  He has had myalgias with atorvastatin, simvastatin, pravastatin, and Crestor and is now not on a statin.    Labs (5/12): LDL 130, HDL 41, TGs 247, K 4.1, creatinine 0.9, LFTs normal Labs (12/12): LDL 119, HDL 41 Labs (4/13); TGs 249, LDL 145  ECG: NSR, poor anterior R wave progression  PMH: 1. Gout 2. HTN 3. Hyperlipidemia: myalgias with simvastatin, pravastatin, Crestor, and atorvastatin.  4. Carotid stenosis: right CEA in 2003.  Carotid dopplers (8/12) with 40-59% LICA stenosis, s/p R CEA.  Carotid dopplers (9/13) with patent right CEA, 40-59% LICA stenosis.  5. Cerebrovascular disease: Occluded basilar artery noted around 2009.  Patient had diplopia as presenting symptom (resolved).  6. Stress myoview in 2010 was normal.  7. Impaired fasting glucose 8. Aortic stenosis: Mild to moderate by echo in 12/10.  Echo (8/12) with EF 65-70%, mild AS with mean gradient 14 mmHg.  9. 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  Retired Nutritional therapist.    FH: Father with MI at 71, mother with skin cancer.    Current Outpatient Prescriptions  Medication Sig Dispense Refill  . amLODipine (NORVASC) 5 MG tablet Take 1 tablet (5 mg total) by mouth daily.  90 tablet  3  .  aspirin 81 MG tablet Take 81 mg by mouth daily.      . benazepril (LOTENSIN) 20 MG tablet Take 1 tablet (20 mg total) by mouth daily.  90 tablet  3  . metoprolol succinate (TOPROL-XL) 50 MG 24 hr tablet Take 1 tablet (50 mg total) by mouth daily.  90 tablet  3  . omega-3 acid ethyl esters (LOVAZA) 1 G capsule Take 2 g by mouth at bedtime.      . niacin (NIASPAN) 500 MG CR tablet 1 daily at bedtime for one  week, then increase to 2 daily at bedtime. Take this 30 minutes after you take aspirin.  60 tablet  3  . DISCONTD: omega-3 acid ethyl esters (LOVAZA) 1 G capsule Take 2 capsules (2 g total) by mouth 2 (two) times daily.  360 capsule  3    BP 130/70  Pulse 59  Ht 6' (1.829 m)  Wt 197 lb 6.4 oz (89.54 kg)  BMI 26.77 kg/m2 General: NAD Neck: No JVD, no thyromegaly or thyroid nodule.  Lungs: Clear to auscultation bilaterally with normal respiratory effort. CV: Nondisplaced PMI.  Heart regular S1/S2, no S3/S4, 1/6 SEM.  Trace ankle edema.  Left carotid bruit.  Normal pedal pulses.  Abdomen: Soft, nontender, no hepatosplenomegaly, no distention.  Neurologic: Alert and oriented x 3.  Psych: Normal affect. Extremities: No clubbing or cyanosis.   Assessment/Plan: 1.  Carotid stenosis: Stable disease.  Needs repeat carotids in 9/14.   2. Hyperlipidemia: Patient has tried a variety of statins and has had myalgias with all of them. He has known vascular disease, so ideally would have LDL < 70.  He is not going to be able to take a statin.  I will try him on Niaspan 500 mg qhs (take ASA prior).  If he tolerates this, increase to 1000 mg qhs.  He thinks he has been able to take Niaspan in the past.  I will have him get lipids/LFTs in 2 months.   3. HTN: BP is under control on current regimen.  4. AS: Mild on last echo.    Marca Ancona 03/12/2012 10:58 PM

## 2012-03-19 ENCOUNTER — Other Ambulatory Visit: Payer: Self-pay | Admitting: *Deleted

## 2012-04-29 ENCOUNTER — Other Ambulatory Visit (INDEPENDENT_AMBULATORY_CARE_PROVIDER_SITE_OTHER): Payer: Medicare Other

## 2012-04-29 DIAGNOSIS — I359 Nonrheumatic aortic valve disorder, unspecified: Secondary | ICD-10-CM | POA: Diagnosis not present

## 2012-04-29 DIAGNOSIS — I6529 Occlusion and stenosis of unspecified carotid artery: Secondary | ICD-10-CM

## 2012-04-29 LAB — LIPID PANEL
HDL: 38.5 mg/dL — ABNORMAL LOW (ref >39.00)
Total CHOL/HDL Ratio: 6
VLDL: 42.8 mg/dL — ABNORMAL HIGH (ref 0.0–40.0)

## 2012-04-29 LAB — BASIC METABOLIC PANEL
BUN: 19 mg/dL (ref 6–23)
CO2: 30 mEq/L (ref 19–32)
Calcium: 9 mg/dL (ref 8.4–10.5)
Chloride: 102 mEq/L (ref 96–112)
Creatinine, Ser: 1.1 mg/dL (ref 0.4–1.5)

## 2012-04-29 LAB — CBC WITH DIFFERENTIAL/PLATELET
Basophils Relative: 0.5 % (ref 0.0–3.0)
HCT: 48.5 % (ref 39.0–52.0)
Hemoglobin: 15.7 g/dL (ref 13.0–17.0)
Lymphocytes Relative: 31.8 % (ref 12.0–46.0)
Lymphs Abs: 2.6 10*3/uL (ref 0.7–4.0)
MCHC: 32.4 g/dL (ref 30.0–36.0)
Monocytes Relative: 8.6 % (ref 3.0–12.0)
Neutro Abs: 4.4 10*3/uL (ref 1.4–7.7)
RBC: 5.47 Mil/uL (ref 4.22–5.81)

## 2012-04-29 LAB — HEPATIC FUNCTION PANEL
Albumin: 3.7 g/dL (ref 3.5–5.2)
Bilirubin, Direct: 0 mg/dL (ref 0.0–0.3)
Total Protein: 6.7 g/dL (ref 6.0–8.3)

## 2012-05-08 ENCOUNTER — Other Ambulatory Visit: Payer: Self-pay | Admitting: *Deleted

## 2012-05-08 DIAGNOSIS — E785 Hyperlipidemia, unspecified: Secondary | ICD-10-CM

## 2012-05-08 DIAGNOSIS — I6529 Occlusion and stenosis of unspecified carotid artery: Secondary | ICD-10-CM

## 2012-05-08 DIAGNOSIS — I359 Nonrheumatic aortic valve disorder, unspecified: Secondary | ICD-10-CM

## 2012-05-08 MED ORDER — BENAZEPRIL HCL 20 MG PO TABS: 20.0000 mg | ORAL_TABLET | Freq: Every day | ORAL | Status: DC

## 2012-05-08 MED ORDER — METOPROLOL SUCCINATE ER 50 MG PO TB24: 50.0000 mg | ORAL_TABLET | Freq: Every day | ORAL | Status: DC

## 2012-05-08 MED ORDER — AMLODIPINE BESYLATE 5 MG PO TABS: 5.0000 mg | ORAL_TABLET | Freq: Every day | ORAL | Status: DC

## 2012-05-27 ENCOUNTER — Telehealth: Payer: Self-pay | Admitting: Cardiology

## 2012-05-27 MED ORDER — HYDROCHLOROTHIAZIDE 25 MG PO TABS: 25.0000 mg | ORAL_TABLET | Freq: Every day | ORAL | Status: DC

## 2012-05-27 NOTE — Telephone Encounter (Signed)
All pt's rx's were called in except hctz , pls call in to [facility] , i'm not showing this on his med list, is he still to take it? pls advise [phone]

## 2012-05-27 NOTE — Telephone Encounter (Signed)
Spoke with pt. Pt states he has been taking HCTZ 25mg  daily regularly. I will add this back on his med list and refill for him.

## 2012-05-27 NOTE — Telephone Encounter (Signed)
LMTCB --HCTZ not on med list.

## 2012-06-25 ENCOUNTER — Other Ambulatory Visit (INDEPENDENT_AMBULATORY_CARE_PROVIDER_SITE_OTHER): Payer: Medicare Other

## 2012-06-25 DIAGNOSIS — E785 Hyperlipidemia, unspecified: Secondary | ICD-10-CM | POA: Diagnosis not present

## 2012-06-25 LAB — LIPID PANEL
Cholesterol: 183 mg/dL (ref 0–200)
VLDL: 36.2 mg/dL (ref 0.0–40.0)

## 2012-06-28 ENCOUNTER — Other Ambulatory Visit: Payer: Self-pay | Admitting: *Deleted

## 2012-07-01 ENCOUNTER — Other Ambulatory Visit: Payer: Self-pay | Admitting: *Deleted

## 2012-07-01 MED ORDER — SIMVASTATIN 20 MG PO TABS: 20.0000 mg | ORAL_TABLET | Freq: Every day | ORAL | Status: DC

## 2012-07-03 ENCOUNTER — Other Ambulatory Visit: Payer: Medicare Other

## 2012-08-28 ENCOUNTER — Encounter: Payer: Self-pay | Admitting: Cardiology

## 2012-08-28 ENCOUNTER — Ambulatory Visit (INDEPENDENT_AMBULATORY_CARE_PROVIDER_SITE_OTHER): Payer: Medicare Other | Admitting: Cardiology

## 2012-08-28 VITALS — BP 142/88 | HR 71 | Ht 72.0 in | Wt 199.0 lb

## 2012-08-28 DIAGNOSIS — E785 Hyperlipidemia, unspecified: Secondary | ICD-10-CM

## 2012-08-28 DIAGNOSIS — I10 Essential (primary) hypertension: Secondary | ICD-10-CM

## 2012-08-28 DIAGNOSIS — I350 Nonrheumatic aortic (valve) stenosis: Secondary | ICD-10-CM

## 2012-08-28 DIAGNOSIS — I6523 Occlusion and stenosis of bilateral carotid arteries: Secondary | ICD-10-CM

## 2012-08-28 DIAGNOSIS — I359 Nonrheumatic aortic valve disorder, unspecified: Secondary | ICD-10-CM

## 2012-08-28 DIAGNOSIS — I658 Occlusion and stenosis of other precerebral arteries: Secondary | ICD-10-CM | POA: Diagnosis not present

## 2012-08-28 MED ORDER — SIMVASTATIN 20 MG PO TABS: 20.0000 mg | ORAL_TABLET | Freq: Every day | ORAL | Status: DC

## 2012-08-28 NOTE — Patient Instructions (Addendum)
Your physician recommends that you schedule a follow-up appointment in: 6 months with Dr. Shirlee Latch  Your physician recommends that you return for lab work in: 6 months for Liver panel and Lipids prior to your visit with Dr. Shirlee Latch   Your physician has requested that you have a carotid duplex in 6 months. This test is an ultrasound of the carotid arteries in your neck. It looks at blood flow through these arteries that supply the brain with blood. Allow one hour for this exam. There are no restrictions or special instructions.  Your physician has requested that you have an echocardiogram in 6 months. Echocardiography is a painless test that uses sound waves to create images of your heart. It provides your doctor with information about the size and shape of your heart and how well your hearts chambers and valves are working. This procedure takes approximately one hour. There are no restrictions for this procedure.

## 2012-08-28 NOTE — Progress Notes (Signed)
Patient ID: [name], male   DOB: 11-20-1928, 79 y.o.   MRN: [id number] PCP: Dr. Clelia Croft  79 yo with history of HTN, carotid stenosis, cerebrovascular disease, and aortic stenosis presents for cardiology followup.  He had a right CEA in 2003.  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  Stress myoview in 2010 was normal.  Echo in 8/12 showed mild aortic stenosis.   Symptomatically, he has been stable.  He can walk on flat ground and up hills without significant dyspnea.  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  No chest pain. He is mildly short of breath when he does heavy work.  BP is under reasonable control.  He has been tolerating simvastatin without myalgias.     Labs (5/12): LDL 130, HDL 41, TGs 247, K 4.1, creatinine 0.9, LFTs normal Labs (12/12): LDL 119, HDL 41 Labs (4/13); TGs 249, LDL 145 Labs (11/13): K 3.8, creatinine 1.1, HDL 39, LDL 146  ECG: NSR, nonspecific T wave inversions in III, AVF, V6  PMH: 1. Gout 2. HTN 3. Hyperlipidemia: myalgias with pravastatin, Crestor, and atorvastatin.  4. Carotid stenosis: right CEA in 2003.  Carotid dopplers (8/12) with 40-59% LICA stenosis, s/p R CEA.  Carotid dopplers (9/13) with patent right CEA, 40-59% LICA stenosis.  5. Cerebrovascular disease: Occluded basilar artery noted around 2009.  Patient had diplopia as presenting symptom (resolved).  6. Stress myoview in 2010 was normal.  7. Impaired fasting glucose 8. Aortic stenosis: Mild to moderate by echo in 12/10.  Echo (8/12) with EF 65-70%, mild AS with mean gradient 14 mmHg.  9. 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  Retired Nutritional therapist.    FH: Father with MI at 60, mother with skin cancer.    Current Outpatient Prescriptions  Medication Sig Dispense Refill  . amLODipine (NORVASC) 5 MG tablet Take 1 tablet (5 mg total) by mouth daily.  90 tablet  3  . aspirin 81 MG tablet Take 81  mg by mouth daily.      . benazepril (LOTENSIN) 20 MG tablet Take 1 tablet (20 mg total) by mouth daily.  90 tablet  3  . hydrochlorothiazide (HYDRODIURIL) 25 MG tablet Take 1 tablet (25 mg total) by mouth daily.  90 tablet  3  . metoprolol succinate (TOPROL-XL) 50 MG 24 hr tablet Take 1 tablet (50 mg total) by mouth daily.  90 tablet  3  . simvastatin (ZOCOR) 20 MG tablet Take 1 tablet (20 mg total) by mouth at bedtime.  90 tablet  3   No current facility-administered medications for this visit.    BP 142/88  Pulse 71  Ht 6' (1.829 m)  Wt 199 lb (90.266 kg)  BMI 26.98 kg/m2 General: NAD Neck: No JVD, no thyromegaly or thyroid nodule.  Lungs: Clear to auscultation bilaterally with normal respiratory effort. CV: Nondisplaced PMI.  Heart regular S1/S2, no S3/S4, 1/6 SEM.  Trace ankle edema.  Left carotid bruit.  Normal pedal pulses.  Abdomen: Soft, nontender, no hepatosplenomegaly, no distention.  Neurologic: Alert and oriented x 3.  Psych: Normal affect. Extremities: No clubbing or cyanosis.   Assessment/Plan: 1. Carotid stenosis: Stable disease.  Needs repeat carotids in 9/14.   2. Hyperlipidemia: Needs statin given vascular disease.  He is tolerating simvastatin 20 mg daily without the myalgias that limited use of other statins.  3. HTN: BP  is under control on current regimen.  4. AS: Mild on last echo in 2012.  I will get a repeat echo with his carotid dopplers in 9/14.    Marca Ancona 08/28/2012 11:08 PM

## 2012-12-04 DIAGNOSIS — H2510 Age-related nuclear cataract, unspecified eye: Secondary | ICD-10-CM | POA: Diagnosis not present

## 2012-12-27 DIAGNOSIS — H2589 Other age-related cataract: Secondary | ICD-10-CM | POA: Diagnosis not present

## 2013-01-14 DIAGNOSIS — H2589 Other age-related cataract: Secondary | ICD-10-CM | POA: Diagnosis not present

## 2013-01-14 DIAGNOSIS — H269 Unspecified cataract: Secondary | ICD-10-CM | POA: Diagnosis not present

## 2013-01-21 DIAGNOSIS — H3530 Unspecified macular degeneration: Secondary | ICD-10-CM | POA: Diagnosis not present

## 2013-05-28 ENCOUNTER — Other Ambulatory Visit: Payer: Self-pay | Admitting: Cardiology

## 2013-09-09 ENCOUNTER — Other Ambulatory Visit: Payer: Self-pay | Admitting: Cardiology

## 2013-09-09 ENCOUNTER — Telehealth: Payer: Self-pay | Admitting: *Deleted

## 2013-09-09 NOTE — Telephone Encounter (Signed)
Patient needs appointment with Dr Aundra Dubin. Can you schedule this? Thanks, MI

## 2013-09-24 ENCOUNTER — Encounter: Payer: Self-pay | Admitting: Cardiology

## 2013-09-24 ENCOUNTER — Encounter: Payer: Self-pay | Admitting: *Deleted

## 2013-09-24 ENCOUNTER — Ambulatory Visit (INDEPENDENT_AMBULATORY_CARE_PROVIDER_SITE_OTHER): Payer: Medicare Other | Admitting: Cardiology

## 2013-09-24 VITALS — BP 168/82 | HR 74 | Ht 72.0 in | Wt 201.0 lb

## 2013-09-24 DIAGNOSIS — E785 Hyperlipidemia, unspecified: Secondary | ICD-10-CM

## 2013-09-24 DIAGNOSIS — I6529 Occlusion and stenosis of unspecified carotid artery: Secondary | ICD-10-CM

## 2013-09-24 DIAGNOSIS — I359 Nonrheumatic aortic valve disorder, unspecified: Secondary | ICD-10-CM | POA: Diagnosis not present

## 2013-09-24 DIAGNOSIS — I658 Occlusion and stenosis of other precerebral arteries: Secondary | ICD-10-CM

## 2013-09-24 DIAGNOSIS — I350 Nonrheumatic aortic (valve) stenosis: Secondary | ICD-10-CM

## 2013-09-24 DIAGNOSIS — I10 Essential (primary) hypertension: Secondary | ICD-10-CM | POA: Diagnosis not present

## 2013-09-24 LAB — BASIC METABOLIC PANEL
BUN: 15 mg/dL (ref 6–23)
CHLORIDE: 101 meq/L (ref 96–112)
CO2: 28 meq/L (ref 19–32)
CREATININE: 1 mg/dL (ref 0.4–1.5)
Calcium: 9.2 mg/dL (ref 8.4–10.5)
GFR: 75.03 mL/min (ref >60.00)
Glucose, Bld: 138 mg/dL — ABNORMAL HIGH (ref 70–99)
Potassium: 3.9 mEq/L (ref 3.5–5.1)
Sodium: 137 mEq/L (ref 135–145)

## 2013-09-24 MED ORDER — AMLODIPINE BESYLATE 10 MG PO TABS: 10.0000 mg | ORAL_TABLET | Freq: Every day | ORAL | Status: DC

## 2013-09-24 MED ORDER — EZETIMIBE 10 MG PO TABS: 10.0000 mg | ORAL_TABLET | Freq: Every day | ORAL | Status: DC

## 2013-09-24 NOTE — Patient Instructions (Signed)
Increase amlodipine to 10mg  daily. You can take 2 of your 5mg  tablets daily at the same time and use your current supply.  Start Zetia 10mg  daily.  Your physician recommends that you have lab work today--BMET.  Your physician has requested that you have a carotid duplex. This test is an ultrasound of the carotid arteries in your neck. It looks at blood flow through these arteries that supply the brain with blood. Allow one hour for this exam. There are no restrictions or special instructions.  Your physician recommends that you return for a FASTING lipid profile /liver profile in  2 months.  Your physician wants you to follow-up in: 6 months with Dr Aundra Dubin. (October 2015). You will receive a reminder letter in the mail two months in advance. If you don't receive a letter, please call our office to schedule the follow-up appointment.

## 2013-09-24 NOTE — Progress Notes (Signed)
Patient ID: [name], male   DOB: Sep 02, 1928, 80 y.o.   MRN: [id number] PCP: In [place], does not remember name  80 yo with history of HTN, carotid stenosis, cerebrovascular disease, and aortic stenosis presents for cardiology followup.  He had a right CEA in 2003.  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  Stress myoview in 2010 was normal.  Echo in 8/12 showed mild aortic stenosis.   Symptomatically, he has been stable except for low back pain.  This pain radiates into his left hip.  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  He can walk on flat ground and up hills without significant dyspnea.  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  No chest pain. He is mildly short of breath when he does heavy work.  BP is high today and has been running high when he checks it at home.  He stopped simvastatin due to myalgias.  Myalgias improved off statin.  Labs (5/12): LDL 130, HDL 41, TGs 247, K 4.1, creatinine 0.9, LFTs normal Labs (12/12): LDL 119, HDL 41 Labs (4/13); TGs 249, LDL 145 Labs (11/13): K 3.8, creatinine 1.1, HDL 39, LDL 146 Labs (1/14): LDL 111  ECG: NSR, nonspecific T wave flattening AVL and V6  PMH: 1. Gout 2. HTN 3. Hyperlipidemia: myalgias with pravastatin, Crestor, simvastatin, and atorvastatin.  4. Carotid stenosis: right CEA in 2003.  Carotid dopplers (2/45) with 80-99% LICA stenosis, s/p R CEA.  Carotid dopplers (9/13) with patent right CEA, 83-38% LICA stenosis.  5. Cerebrovascular disease: Occluded basilar artery noted around 2009.  Patient had diplopia as presenting symptom (resolved).  6. Stress myoview in 2010 was normal.  7. Impaired fasting glucose 8. Aortic stenosis: Mild to moderate by echo in 12/10.  Echo (8/12) with EF 65-70%, mild AS with mean gradient 14 mmHg.  9. H/o varicella zoster 10. 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  Retired Development worker, community.    FH: Father with  MI at 67, mother with skin cancer.   ROS: All systems reviewed and negative except as per HPI.   Current Outpatient Prescriptions  Medication Sig Dispense Refill  . aspirin 81 MG tablet Take 81 mg by mouth daily.      . benazepril (LOTENSIN) 20 MG tablet Take 1 tablet by mouth  daily  90 tablet  0  . hydrochlorothiazide (HYDRODIURIL) 25 MG tablet Take 1 tablet by mouth  daily  90 tablet  0  . metoprolol succinate (TOPROL-XL) 50 MG 24 hr tablet Take 1 tablet by mouth  daily  90 tablet  0  . amLODipine (NORVASC) 10 MG tablet Take 1 tablet (10 mg total) by mouth daily.  90 tablet  3  . ezetimibe (ZETIA) 10 MG tablet Take 1 tablet (10 mg total) by mouth daily.  90 tablet  0   No current facility-administered medications for this visit.    BP 168/82  Pulse 74  Ht 6' (1.829 m)  Wt 91.173 kg (201 lb)  BMI 27.25 kg/m2 General: NAD Neck: No JVD, no thyromegaly or thyroid nodule.  Lungs: Clear to auscultation bilaterally with normal respiratory effort. CV: Nondisplaced PMI.  Heart regular S1/S2, no S3/S4, 2/6 early SEM.  1+ ankle edema.  Left carotid bruit.  Normal pedal pulses.  Abdomen: Soft, nontender, no hepatosplenomegaly, no distention.  Neurologic: Alert and oriented x 3.  Psych:  Normal affect. Extremities: No clubbing or cyanosis.   Assessment/Plan: 1. Carotid stenosis: I will check carotid dopplers.     2. Hyperlipidemia: He has not been able to tolerate any statin without myalgias. Given his vascular disease (carotid stenosis), I will start him on Zetia 10 mg daily with lipids/LFTs in 2 months.  3. HTN: BP running high, increase amlodipine to 10 mg daily.  4. AS: Mild on last echo in 2012.  No change in symptoms.  Will hold off on repeat echo for now.   Larey Dresser 09/24/2013

## 2013-10-02 ENCOUNTER — Ambulatory Visit (HOSPITAL_COMMUNITY): Payer: Medicare Other | Attending: Cardiology | Admitting: Cardiology

## 2013-10-02 DIAGNOSIS — I6529 Occlusion and stenosis of unspecified carotid artery: Secondary | ICD-10-CM

## 2013-10-02 DIAGNOSIS — I359 Nonrheumatic aortic valve disorder, unspecified: Secondary | ICD-10-CM | POA: Insufficient documentation

## 2013-10-02 DIAGNOSIS — I10 Essential (primary) hypertension: Secondary | ICD-10-CM

## 2013-10-02 DIAGNOSIS — E785 Hyperlipidemia, unspecified: Secondary | ICD-10-CM

## 2013-10-02 DIAGNOSIS — I658 Occlusion and stenosis of other precerebral arteries: Secondary | ICD-10-CM

## 2013-10-02 DIAGNOSIS — I350 Nonrheumatic aortic (valve) stenosis: Secondary | ICD-10-CM

## 2013-10-02 NOTE — Progress Notes (Signed)
Carotid duplex complete 

## 2013-11-25 ENCOUNTER — Other Ambulatory Visit: Payer: Medicare Other

## 2013-11-28 ENCOUNTER — Other Ambulatory Visit (INDEPENDENT_AMBULATORY_CARE_PROVIDER_SITE_OTHER): Payer: Medicare Other

## 2013-11-28 DIAGNOSIS — I359 Nonrheumatic aortic valve disorder, unspecified: Secondary | ICD-10-CM | POA: Diagnosis not present

## 2013-11-28 DIAGNOSIS — I658 Occlusion and stenosis of other precerebral arteries: Secondary | ICD-10-CM

## 2013-11-28 DIAGNOSIS — I350 Nonrheumatic aortic (valve) stenosis: Secondary | ICD-10-CM

## 2013-11-28 DIAGNOSIS — E785 Hyperlipidemia, unspecified: Secondary | ICD-10-CM | POA: Diagnosis not present

## 2013-11-28 DIAGNOSIS — I10 Essential (primary) hypertension: Secondary | ICD-10-CM | POA: Diagnosis not present

## 2013-11-28 LAB — HEPATIC FUNCTION PANEL
ALK PHOS: 59 U/L (ref 39–117)
ALT: 21 U/L (ref 0–53)
AST: 18 U/L (ref 0–37)
Albumin: 3.7 g/dL (ref 3.5–5.2)
BILIRUBIN TOTAL: 0.5 mg/dL (ref 0.2–1.2)
Bilirubin, Direct: 0.1 mg/dL (ref 0.0–0.3)
Total Protein: 6.6 g/dL (ref 6.0–8.3)

## 2013-11-28 LAB — LIPID PANEL
CHOLESTEROL: 193 mg/dL (ref 0–200)
HDL: 34.7 mg/dL — ABNORMAL LOW (ref >39.00)
LDL Cholesterol: 102 mg/dL — ABNORMAL HIGH (ref 0–99)
NONHDL: 158.3
Total CHOL/HDL Ratio: 6
Triglycerides: 282 mg/dL — ABNORMAL HIGH (ref 0.0–149.0)
VLDL: 56.4 mg/dL — ABNORMAL HIGH (ref 0.0–40.0)

## 2013-12-19 ENCOUNTER — Other Ambulatory Visit: Payer: Self-pay | Admitting: Cardiology

## 2014-03-17 DIAGNOSIS — H2510 Age-related nuclear cataract, unspecified eye: Secondary | ICD-10-CM | POA: Diagnosis not present

## 2014-03-30 ENCOUNTER — Other Ambulatory Visit: Payer: Self-pay | Admitting: Cardiology

## 2014-04-01 ENCOUNTER — Other Ambulatory Visit: Payer: Self-pay

## 2014-04-01 ENCOUNTER — Ambulatory Visit (INDEPENDENT_AMBULATORY_CARE_PROVIDER_SITE_OTHER): Payer: Medicare Other | Admitting: Cardiology

## 2014-04-01 ENCOUNTER — Encounter: Payer: Self-pay | Admitting: *Deleted

## 2014-04-01 ENCOUNTER — Encounter: Payer: Self-pay | Admitting: Cardiology

## 2014-04-01 VITALS — BP 160/82 | HR 75 | Ht 72.0 in | Wt 203.0 lb

## 2014-04-01 DIAGNOSIS — E785 Hyperlipidemia, unspecified: Secondary | ICD-10-CM

## 2014-04-01 DIAGNOSIS — I350 Nonrheumatic aortic (valve) stenosis: Secondary | ICD-10-CM | POA: Diagnosis not present

## 2014-04-01 DIAGNOSIS — I6529 Occlusion and stenosis of unspecified carotid artery: Secondary | ICD-10-CM

## 2014-04-01 DIAGNOSIS — I6522 Occlusion and stenosis of left carotid artery: Secondary | ICD-10-CM | POA: Diagnosis not present

## 2014-04-01 DIAGNOSIS — I10 Essential (primary) hypertension: Secondary | ICD-10-CM

## 2014-04-01 MED ORDER — BENAZEPRIL HCL 40 MG PO TABS: 40.0000 mg | ORAL_TABLET | Freq: Every day | ORAL | Status: DC

## 2014-04-01 NOTE — Patient Instructions (Signed)
Increase benazepril to 40mg  daily. You can take 2 of your 20mg  tablets daily at the same time and use your current supply.  Your physician has requested that you have a carotid duplex. This test is an ultrasound of the carotid arteries in your neck. It looks at blood flow through these arteries that supply the brain with blood. Allow one hour for this exam. There are no restrictions or special instructions.  Your physician has requested that you have an echocardiogram. Echocardiography is a painless test that uses sound waves to create images of your heart. It provides your doctor with information about the size and shape of your heart and how well your heart's chambers and valves are working. This procedure takes approximately one hour. There are no restrictions for this procedure.  Your physician recommends that you return for lab work in: 2 weeks--BMET.   You can have all the testing and lab on the same day in about 2 weeks.   Your physician wants you to follow-up in: 6 months with Dr Aundra Dubin. (April 2016). You will receive a reminder letter in the mail two months in advance. If you don't receive a letter, please call our office to schedule the follow-up appointment.

## 2014-04-02 NOTE — Progress Notes (Signed)
Patient ID: [name], male   DOB: 1928-07-18, 80 y.o.   MRN: [id number] 80 yo with history of HTN, carotid stenosis, cerebrovascular disease, and aortic stenosis presents for cardiology followup.  He had a right CEA in 2003.  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  Stress myoview in 2010 was normal.  Echo in 8/12 showed mild aortic stenosis.   Symptomatically, he has been stable except for low back pain.  This pain radiates into his left hip.  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  He can walk on flat ground and up hills without significant dyspnea.  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  No chest pain. He is mildly short of breath when he does heavy work.  BP is high today and has been running high when he checks it at home.  He is tolerating Zetia.   Labs (5/12): LDL 130, HDL 41, TGs 247, K 4.1, creatinine 0.9, LFTs normal Labs (12/12): LDL 119, HDL 41 Labs (4/13); TGs 249, LDL 145 Labs (11/13): K 3.8, creatinine 1.1, HDL 39, LDL 146 Labs (1/14): LDL 111 Labs (4/15): K 3.9, creatinine 1.0 Labs (6/15): LDL 102, HDL 35, TGs 282  ECG: NSR, normal  PMH: 1. Gout 2. HTN 3. Hyperlipidemia: myalgias with pravastatin, Crestor, simvastatin, and atorvastatin.  4. Carotid stenosis: right CEA in 2003.  Carotid dopplers (5/78) with 46-96% LICA stenosis, s/p R CEA.  Carotid dopplers (9/13) with patent right CEA, 29-52% LICA stenosis.  Carotid dopplers (4/15) with R CEA, 84-13% LICA stenosis.  5. Cerebrovascular disease: Occluded basilar artery noted around 2009.  Patient had diplopia as presenting symptom (resolved).  6. Stress myoview in 2010 was normal.  7. Impaired fasting glucose 8. Aortic stenosis: Mild to moderate by echo in 12/10.  Echo (8/12) with EF 65-70%, mild AS with mean gradient 14 mmHg.  9. H/o varicella zoster 10. 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  Retired Development worker, community.    FH:  Father with MI at 51, mother with skin cancer.   ROS: All systems reviewed and negative except as per HPI.   Current Outpatient Prescriptions  Medication Sig Dispense Refill  . amLODipine (NORVASC) 10 MG tablet Take 1 tablet (10 mg total) by mouth daily.  90 tablet  3  . aspirin 81 MG tablet Take 81 mg by mouth daily.      . hydrochlorothiazide (HYDRODIURIL) 25 MG tablet Take 1 tablet by mouth  daily  90 tablet  3  . metoprolol succinate (TOPROL-XL) 50 MG 24 hr tablet Take 1 tablet by mouth  daily  90 tablet  3  . ZETIA 10 MG tablet Take 1 tablet by mouth  daily  90 tablet  3  . benazepril (LOTENSIN) 40 MG tablet Take 1 tablet (40 mg total) by mouth daily.  90 tablet  3   No current facility-administered medications for this visit.    BP 160/82  Pulse 75  Ht 6' (1.829 m)  Wt 203 lb (92.08 kg)  BMI 27.53 kg/m2 General: NAD Neck: No JVD, no thyromegaly or thyroid nodule.  Lungs: Clear to auscultation bilaterally with normal respiratory effort. CV: Nondisplaced PMI.  Heart regular S1/S2, no S3/S4, 2/6 early SEM.  1+ ankle edema.  Left carotid bruit.  Normal pedal pulses.  Abdomen: Soft, nontender, no hepatosplenomegaly, no distention.  Neurologic: Alert and oriented x  3.  Psych: Normal affect. Extremities: No clubbing or cyanosis.   Assessment/Plan: 1. Carotid stenosis: Progression of LICA stenosis in 4/62.  Need repeat carotids this month.     2. Hyperlipidemia: He has not been able to tolerate any statin without myalgias. He is tolerating Zetia.  3. HTN: BP running high, increase benazepril to 40 mg daily.  BMET in 2 wks.  4. AS: Mild on last echo in 2012.  I will repeat echo this year.     Loralie Champagne 04/02/2014

## 2014-04-15 ENCOUNTER — Ambulatory Visit (HOSPITAL_COMMUNITY): Payer: Medicare Other | Attending: Internal Medicine | Admitting: Cardiology

## 2014-04-15 ENCOUNTER — Ambulatory Visit (HOSPITAL_BASED_OUTPATIENT_CLINIC_OR_DEPARTMENT_OTHER): Payer: Medicare Other | Admitting: *Deleted

## 2014-04-15 ENCOUNTER — Other Ambulatory Visit (INDEPENDENT_AMBULATORY_CARE_PROVIDER_SITE_OTHER): Payer: Medicare Other | Admitting: *Deleted

## 2014-04-15 DIAGNOSIS — I350 Nonrheumatic aortic (valve) stenosis: Secondary | ICD-10-CM

## 2014-04-15 DIAGNOSIS — I6523 Occlusion and stenosis of bilateral carotid arteries: Secondary | ICD-10-CM

## 2014-04-15 DIAGNOSIS — I10 Essential (primary) hypertension: Secondary | ICD-10-CM | POA: Insufficient documentation

## 2014-04-15 DIAGNOSIS — I6522 Occlusion and stenosis of left carotid artery: Secondary | ICD-10-CM

## 2014-04-15 DIAGNOSIS — E785 Hyperlipidemia, unspecified: Secondary | ICD-10-CM | POA: Insufficient documentation

## 2014-04-15 DIAGNOSIS — Z87891 Personal history of nicotine dependence: Secondary | ICD-10-CM | POA: Diagnosis not present

## 2014-04-15 DIAGNOSIS — I359 Nonrheumatic aortic valve disorder, unspecified: Secondary | ICD-10-CM | POA: Diagnosis not present

## 2014-04-15 LAB — BASIC METABOLIC PANEL
BUN: 18 mg/dL (ref 6–23)
CO2: 25 mEq/L (ref 19–32)
Calcium: 9.2 mg/dL (ref 8.4–10.5)
Chloride: 103 mEq/L (ref 96–112)
Creatinine, Ser: 1.3 mg/dL (ref 0.4–1.5)
GFR: 57.52 mL/min — ABNORMAL LOW (ref >60.00)
Glucose, Bld: 181 mg/dL — ABNORMAL HIGH (ref 70–99)
Potassium: 3.8 mEq/L (ref 3.5–5.1)
Sodium: 139 mEq/L (ref 135–145)

## 2014-04-15 NOTE — Progress Notes (Signed)
Echo performed. 

## 2014-04-15 NOTE — Progress Notes (Signed)
Carotid duplex complete 

## 2014-10-12 ENCOUNTER — Encounter: Payer: Self-pay | Admitting: *Deleted

## 2014-10-15 ENCOUNTER — Ambulatory Visit (INDEPENDENT_AMBULATORY_CARE_PROVIDER_SITE_OTHER): Payer: Medicare Other | Admitting: Cardiology

## 2014-10-15 ENCOUNTER — Encounter: Payer: Self-pay | Admitting: Cardiology

## 2014-10-15 ENCOUNTER — Encounter: Payer: Self-pay | Admitting: *Deleted

## 2014-10-15 VITALS — BP 154/88 | HR 66 | Ht 72.0 in | Wt 203.0 lb

## 2014-10-15 DIAGNOSIS — I10 Essential (primary) hypertension: Secondary | ICD-10-CM | POA: Diagnosis not present

## 2014-10-15 DIAGNOSIS — I350 Nonrheumatic aortic (valve) stenosis: Secondary | ICD-10-CM

## 2014-10-15 DIAGNOSIS — I6523 Occlusion and stenosis of bilateral carotid arteries: Secondary | ICD-10-CM

## 2014-10-15 DIAGNOSIS — E785 Hyperlipidemia, unspecified: Secondary | ICD-10-CM | POA: Diagnosis not present

## 2014-10-15 LAB — CBC WITH DIFFERENTIAL/PLATELET
Basophils Absolute: 0 10*3/uL (ref 0.0–0.1)
Basophils Relative: 0.5 % (ref 0.0–3.0)
Eosinophils Absolute: 0.5 10*3/uL (ref 0.0–0.7)
Eosinophils Relative: 6.8 % — ABNORMAL HIGH (ref 0.0–5.0)
HEMATOCRIT: 45.2 % (ref 39.0–52.0)
Hemoglobin: 15.2 g/dL (ref 13.0–17.0)
LYMPHS ABS: 2.4 10*3/uL (ref 0.7–4.0)
Lymphocytes Relative: 29.9 % (ref 12.0–46.0)
MCHC: 33.7 g/dL (ref 30.0–36.0)
MCV: 84.1 fl (ref 78.0–100.0)
MONOS PCT: 8.1 % (ref 3.0–12.0)
Monocytes Absolute: 0.6 10*3/uL (ref 0.1–1.0)
Neutro Abs: 4.3 10*3/uL (ref 1.4–7.7)
Neutrophils Relative %: 54.7 % (ref 43.0–77.0)
PLATELETS: 210 10*3/uL (ref 150.0–400.0)
RBC: 5.37 Mil/uL (ref 4.22–5.81)
RDW: 15 % (ref 11.5–15.5)
WBC: 7.9 10*3/uL (ref 4.0–10.5)

## 2014-10-15 LAB — BASIC METABOLIC PANEL
BUN: 17 mg/dL (ref 6–23)
CO2: 27 meq/L (ref 19–32)
Calcium: 9.1 mg/dL (ref 8.4–10.5)
Chloride: 103 mEq/L (ref 96–112)
Creatinine, Ser: 1.12 mg/dL (ref 0.40–1.50)
GFR: 66.42 mL/min (ref >60.00)
GLUCOSE: 181 mg/dL — AB (ref 70–99)
POTASSIUM: 4 meq/L (ref 3.5–5.1)
Sodium: 137 mEq/L (ref 135–145)

## 2014-10-15 LAB — LIPID PANEL
CHOL/HDL RATIO: 5
CHOLESTEROL: 202 mg/dL — AB (ref 0–200)
HDL: 38 mg/dL — AB (ref >39.00)

## 2014-10-15 LAB — LDL CHOLESTEROL, DIRECT: Direct LDL: 126 mg/dL

## 2014-10-15 MED ORDER — AMLODIPINE BESYLATE 10 MG PO TABS: 10.0000 mg | ORAL_TABLET | Freq: Every day | ORAL | Status: DC

## 2014-10-15 NOTE — Progress Notes (Signed)
Patient ID: [name], male   DOB: Jul 18, 1928, 81 y.o.   MRN: [id number] 81 yo with history of HTN, carotid stenosis, cerebrovascular disease, and aortic stenosis presents for cardiology followup.  He had a right CEA in 2003.  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  Stress myoview in 2010 was normal.  Echo in 10/15 showed mild aortic stenosis.   Symptomatically, he has been stable.  Low back pain continues to be his main complaint. This pain radiates into his left hip.  It has not changed, worse initially in the morning and better when he gets moving.  He can walk on flat ground and up hills without significant dyspnea.  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  No chest pain. 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  BP is high today but he has been out of HCTZ and amlodipine.    Labs (5/12): LDL 130, HDL 41, TGs 247, K 4.1, creatinine 0.9, LFTs normal Labs (12/12): LDL 119, HDL 41 Labs (4/13); TGs 249, LDL 145 Labs (11/13): K 3.8, creatinine 1.1, HDL 39, LDL 146 Labs (1/14): LDL 111 Labs (4/15): K 3.9, creatinine 1.0 Labs (6/15): LDL 102, HDL 35, TGs 282 Labs (10/15): K 3.8, creatinine 1.3  ECG: NSR, normal  PMH: 1. Gout 2. HTN 3. Hyperlipidemia: myalgias with pravastatin, Crestor, simvastatin, and atorvastatin.  4. Carotid stenosis: right CEA in 2003.  Carotid dopplers (5/36) with 64-40% LICA stenosis, s/p R CEA.  Carotid dopplers (9/13) with patent right CEA, 34-74% LICA stenosis.  Carotid dopplers (4/15) with R CEA, 25-95% LICA stenosis.  Carotid dopplers (63/87) with 56-43% LICA stenosis, s/p right CEA.  5. Cerebrovascular disease: Occluded basilar artery noted around 2009.  Patient had diplopia as presenting symptom (resolved).  6. Stress myoview in 2010 was normal.  7. Impaired fasting glucose 8. Aortic stenosis: Mild.  Echo (8/12) with EF 65-70%, mild AS with mean gradient 14 mmHg. Echo (10/15) with  EF 55-60%, moderate LVH, mild AS mean gradient 12 mmHg, AVA 1.8 cm^2.  9. H/o varicella zoster 10. 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  Retired Development worker, community.    FH: Father with MI at 96, mother with skin cancer.   ROS: All systems reviewed and negative except as per HPI.   Current Outpatient Prescriptions  Medication Sig Dispense Refill  . amLODipine (NORVASC) 10 MG tablet Take 1 tablet (10 mg total) by mouth daily. 90 tablet 3  . aspirin 81 MG tablet Take 81 mg by mouth daily.    . benazepril (LOTENSIN) 40 MG tablet Take 1 tablet (40 mg total) by mouth daily. 90 tablet 3  . hydrochlorothiazide (HYDRODIURIL) 25 MG tablet Take 1 tablet by mouth  daily 90 tablet 3  . metoprolol succinate (TOPROL-XL) 50 MG 24 hr tablet Take 1 tablet by mouth  daily 90 tablet 3  . ZETIA 10 MG tablet Take 1 tablet by mouth  daily 90 tablet 3   No current facility-administered medications for this visit.    BP 154/88 mmHg  Pulse 66  Ht 6' (1.829 m)  Wt 203 lb (92.08 kg)  BMI 27.53 kg/m2 General: NAD Neck: No JVD, no thyromegaly or thyroid nodule.  Lungs: Clear to auscultation bilaterally with normal respiratory effort. CV: Nondisplaced PMI.  Heart regular S1/S2, no S3/S4, 1/6 early SEM.  1+ ankle  edema bilaterally.  Left carotid bruit.  Normal pedal pulses.  Abdomen: Soft, nontender, no hepatosplenomegaly, no distention.  Neurologic: Alert and oriented x 3.  Psych: Normal affect. Extremities: No clubbing or cyanosis.   Assessment/Plan: 1. Carotid stenosis: Moderate LICA stenosis, will arrange repeat carotid dopplers.     2. Hyperlipidemia: He has not been able to tolerate any statin without myalgias. He is tolerating Zetia. Lipids today.  3. HTN: BP running high but out of HCTZ and amlodipine.  Will refill his BP meds, should be on amlodipine, HCTZ, Toprol XL, and benazepril.  BMET today.  4. AS: Mild on last echo in 2015.  Can repeat echo in a few  years unless symptoms become concerning.     Followup in 6 months.    Loralie Champagne 10/15/2014

## 2014-10-15 NOTE — Patient Instructions (Signed)
Medication Instructions:  No changes today.  Labwork: Lipid profile/CBCd/BMET today  Testing/Procedures: Your physician has requested that you have a carotid duplex. This test is an ultrasound of the carotid arteries in your neck. It looks at blood flow through these arteries that supply the brain with blood. Allow one hour for this exam. There are no restrictions or special instructions.   Follow-Up: Your physician wants you to follow-up in: 6 months with Dr Aundra Dubin. (October 2016) You will receive a reminder letter in the mail two months in advance. If you don't receive a letter, please call our office to schedule the follow-up appointment.

## 2014-10-19 ENCOUNTER — Other Ambulatory Visit: Payer: Self-pay | Admitting: *Deleted

## 2014-10-19 DIAGNOSIS — E785 Hyperlipidemia, unspecified: Secondary | ICD-10-CM

## 2014-10-19 DIAGNOSIS — R7309 Other abnormal glucose: Secondary | ICD-10-CM

## 2014-10-19 MED ORDER — FENOFIBRATE 48 MG PO TABS: 48.0000 mg | ORAL_TABLET | Freq: Every day | ORAL | Status: DC

## 2014-10-21 ENCOUNTER — Other Ambulatory Visit (INDEPENDENT_AMBULATORY_CARE_PROVIDER_SITE_OTHER): Payer: Medicare Other | Admitting: *Deleted

## 2014-10-21 DIAGNOSIS — E785 Hyperlipidemia, unspecified: Secondary | ICD-10-CM | POA: Diagnosis not present

## 2014-10-21 DIAGNOSIS — R7309 Other abnormal glucose: Secondary | ICD-10-CM | POA: Diagnosis not present

## 2014-10-21 LAB — HEMOGLOBIN A1C: HEMOGLOBIN A1C: 8.6 % — AB (ref 4.6–6.5)

## 2014-10-26 ENCOUNTER — Ambulatory Visit (HOSPITAL_COMMUNITY): Payer: Medicare Other | Attending: Cardiovascular Disease

## 2014-10-26 DIAGNOSIS — E785 Hyperlipidemia, unspecified: Secondary | ICD-10-CM

## 2014-10-26 DIAGNOSIS — I6523 Occlusion and stenosis of bilateral carotid arteries: Secondary | ICD-10-CM

## 2014-10-26 DIAGNOSIS — I10 Essential (primary) hypertension: Secondary | ICD-10-CM

## 2014-10-26 DIAGNOSIS — I350 Nonrheumatic aortic (valve) stenosis: Secondary | ICD-10-CM | POA: Diagnosis not present

## 2014-10-30 ENCOUNTER — Encounter: Payer: Self-pay | Admitting: *Deleted

## 2014-11-09 DIAGNOSIS — J209 Acute bronchitis, unspecified: Secondary | ICD-10-CM | POA: Diagnosis not present

## 2014-11-10 ENCOUNTER — Encounter: Payer: Self-pay | Admitting: *Deleted

## 2014-12-01 ENCOUNTER — Telehealth: Payer: Self-pay | Admitting: Cardiology

## 2014-12-01 NOTE — Telephone Encounter (Signed)
Spoke with patient. He has not been able to secure a PCP. He states the primary care physicians in his area are not accepting new patients and his previous PCP stated he has not seen him in several years and he is not able to re-establish him to the practice, as he is not taking on new patients. Patient states he has never been told he has diabetes or "sugar" problems. Provided education as an overview for elevated BS, importance of medical care and difference between glucose lab and A1C lab results. Patient verified that he has been experiencing excessive thirst, frequent urination and fatigue. He states Dr. Owens Shark at [facility] in [place] has seen him before for urgent matters and he is willing to see him as his PCP, if Dr. Owens Shark is able to take him on to his practice.  Patient asked if we could contact his sister, next door, for any messages. She is listed on his chart as his Emergency Contact. He stated his has not been successful in obtaining a PCP close to his home town, [place]. Offered to let him know if any PCP can be identified for him to reach out to regarding New Patient appointment. He asked to please let his sister know of anything if we find out anything, as he seldom can answer the phone in time and he does not have an answering machine. Nurse called Dr. Owens Shark however he only sees patient in Urgent Care capacity. Patient referred to [facility] in [place]. Called and Dr. Cathi Roan is taking on new patients. Practice scheduled patient for New Patient appointment for 12/16/14 @ 9:30 am.  Provided this information to patient's sister. She will contact him at this time with the appointment date/time and address. Dr. Leonette Most office has added him on to the schedule for 6/29.

## 2014-12-01 NOTE — Telephone Encounter (Signed)
Follow Up       Pt returning Paula's phone call.

## 2014-12-01 NOTE — Telephone Encounter (Signed)
Follow up      Returning [name]'s call

## 2014-12-01 NOTE — Telephone Encounter (Signed)
No answer at home phone # - 6/14 @ 1:52pm

## 2014-12-15 ENCOUNTER — Other Ambulatory Visit (INDEPENDENT_AMBULATORY_CARE_PROVIDER_SITE_OTHER): Payer: Medicare Other

## 2014-12-15 DIAGNOSIS — R7309 Other abnormal glucose: Secondary | ICD-10-CM | POA: Diagnosis not present

## 2014-12-15 DIAGNOSIS — E785 Hyperlipidemia, unspecified: Secondary | ICD-10-CM | POA: Diagnosis not present

## 2014-12-15 LAB — LIPID PANEL
CHOLESTEROL: 195 mg/dL (ref 0–200)
HDL: 33.8 mg/dL — ABNORMAL LOW (ref >39.00)
NonHDL: 161.2
Total CHOL/HDL Ratio: 6
Triglycerides: 226 mg/dL — ABNORMAL HIGH (ref 0.0–149.0)
VLDL: 45.2 mg/dL — ABNORMAL HIGH (ref 0.0–40.0)

## 2014-12-15 LAB — HEPATIC FUNCTION PANEL
ALBUMIN: 3.8 g/dL (ref 3.5–5.2)
ALT: 18 U/L (ref 0–53)
AST: 18 U/L (ref 0–37)
Alkaline Phosphatase: 48 U/L (ref 39–117)
BILIRUBIN TOTAL: 0.5 mg/dL (ref 0.2–1.2)
Bilirubin, Direct: 0 mg/dL (ref 0.0–0.3)
Total Protein: 6.4 g/dL (ref 6.0–8.3)

## 2014-12-15 LAB — LDL CHOLESTEROL, DIRECT: Direct LDL: 133 mg/dL

## 2014-12-16 DIAGNOSIS — I119 Hypertensive heart disease without heart failure: Secondary | ICD-10-CM | POA: Diagnosis not present

## 2014-12-16 DIAGNOSIS — E780 Pure hypercholesterolemia: Secondary | ICD-10-CM | POA: Diagnosis not present

## 2014-12-16 DIAGNOSIS — E1165 Type 2 diabetes mellitus with hyperglycemia: Secondary | ICD-10-CM | POA: Diagnosis not present

## 2014-12-16 DIAGNOSIS — M479 Spondylosis, unspecified: Secondary | ICD-10-CM | POA: Diagnosis not present

## 2015-01-15 DIAGNOSIS — E1165 Type 2 diabetes mellitus with hyperglycemia: Secondary | ICD-10-CM | POA: Diagnosis not present

## 2015-01-20 ENCOUNTER — Other Ambulatory Visit: Payer: Self-pay | Admitting: Cardiology

## 2015-03-15 DIAGNOSIS — H2512 Age-related nuclear cataract, left eye: Secondary | ICD-10-CM | POA: Diagnosis not present

## 2015-03-15 DIAGNOSIS — H2701 Aphakia, right eye: Secondary | ICD-10-CM | POA: Diagnosis not present

## 2015-03-15 DIAGNOSIS — H532 Diplopia: Secondary | ICD-10-CM | POA: Diagnosis not present

## 2015-03-18 DIAGNOSIS — E1165 Type 2 diabetes mellitus with hyperglycemia: Secondary | ICD-10-CM | POA: Diagnosis not present

## 2015-03-24 DIAGNOSIS — H2512 Age-related nuclear cataract, left eye: Secondary | ICD-10-CM | POA: Diagnosis not present

## 2015-03-24 DIAGNOSIS — H2701 Aphakia, right eye: Secondary | ICD-10-CM | POA: Diagnosis not present

## 2015-03-24 DIAGNOSIS — H532 Diplopia: Secondary | ICD-10-CM | POA: Diagnosis not present

## 2015-03-25 DIAGNOSIS — E1129 Type 2 diabetes mellitus with other diabetic kidney complication: Secondary | ICD-10-CM | POA: Diagnosis not present

## 2015-03-25 DIAGNOSIS — R809 Proteinuria, unspecified: Secondary | ICD-10-CM | POA: Diagnosis not present

## 2015-03-25 DIAGNOSIS — I119 Hypertensive heart disease without heart failure: Secondary | ICD-10-CM | POA: Diagnosis not present

## 2015-03-25 DIAGNOSIS — Z0000 Encounter for general adult medical examination without abnormal findings: Secondary | ICD-10-CM | POA: Diagnosis not present

## 2015-03-25 DIAGNOSIS — E782 Mixed hyperlipidemia: Secondary | ICD-10-CM | POA: Diagnosis not present

## 2015-04-26 ENCOUNTER — Other Ambulatory Visit: Payer: Self-pay | Admitting: Cardiology

## 2015-08-02 ENCOUNTER — Other Ambulatory Visit: Payer: Self-pay | Admitting: Cardiology

## 2015-08-02 MED ORDER — FENOFIBRATE 48 MG PO TABS: ORAL_TABLET | ORAL | Status: DC

## 2015-08-05 ENCOUNTER — Ambulatory Visit (INDEPENDENT_AMBULATORY_CARE_PROVIDER_SITE_OTHER): Payer: Medicare Other | Admitting: Cardiology

## 2015-08-05 ENCOUNTER — Encounter: Payer: Self-pay | Admitting: Cardiology

## 2015-08-05 VITALS — BP 164/80 | HR 70 | Ht 72.0 in | Wt 202.0 lb

## 2015-08-05 DIAGNOSIS — I6529 Occlusion and stenosis of unspecified carotid artery: Secondary | ICD-10-CM | POA: Diagnosis not present

## 2015-08-05 DIAGNOSIS — I350 Nonrheumatic aortic (valve) stenosis: Secondary | ICD-10-CM

## 2015-08-05 DIAGNOSIS — E785 Hyperlipidemia, unspecified: Secondary | ICD-10-CM

## 2015-08-05 DIAGNOSIS — I10 Essential (primary) hypertension: Secondary | ICD-10-CM | POA: Diagnosis not present

## 2015-08-05 DIAGNOSIS — Z79899 Other long term (current) drug therapy: Secondary | ICD-10-CM

## 2015-08-05 LAB — LIPID PANEL
Cholesterol: 214 mg/dL — ABNORMAL HIGH (ref 125–200)
HDL: 36 mg/dL — ABNORMAL LOW (ref >=40)
LDL CALC: 136 mg/dL — AB (ref <130)
Total CHOL/HDL Ratio: 5.9 Ratio — ABNORMAL HIGH (ref <=5.0)
Triglycerides: 211 mg/dL — ABNORMAL HIGH (ref <150)
VLDL: 42 mg/dL — AB (ref <30)

## 2015-08-05 LAB — BASIC METABOLIC PANEL
BUN: 15 mg/dL (ref 7–25)
CHLORIDE: 103 mmol/L (ref 98–110)
CO2: 27 mmol/L (ref 20–31)
CREATININE: 1.12 mg/dL — AB (ref 0.70–1.11)
Calcium: 9.2 mg/dL (ref 8.6–10.3)
Glucose, Bld: 124 mg/dL — ABNORMAL HIGH (ref 65–99)
POTASSIUM: 3.7 mmol/L (ref 3.5–5.3)
Sodium: 139 mmol/L (ref 135–146)

## 2015-08-05 MED ORDER — HYDROCHLOROTHIAZIDE 50 MG PO TABS: 50.0000 mg | ORAL_TABLET | Freq: Every day | ORAL | Status: AC

## 2015-08-05 NOTE — Patient Instructions (Signed)
Medication Instructions:  Your physician has recommended you make the following change in your medication:  1) INCREASE Hydrochlorothiazide to 50 mg daily  Labwork: Today: BMET & Lipids  Testing/Procedures: Your physician has requested that you have an echocardiogram. Echocardiography is a painless test that uses sound waves to create images of your heart. It provides your doctor with information about the size and shape of your heart and how well your heart's chambers and valves are working. This procedure takes approximately one hour. There are no restrictions for this procedure.  Follow-Up: Your physician wants you to follow-up in: 6 months with Dr. Aundra Dubin. You will receive a reminder letter in the mail two months in advance. If you don't receive a letter, please call our office to schedule the follow-up appointment.  If you need a refill on your cardiac medications before your next appointment, please call your pharmacy.  Thank you for choosing [facility]!!

## 2015-08-06 NOTE — Progress Notes (Signed)
Patient ID: [name], male   DOB: 1928-11-28, 82 y.o.   MRN: [id number]  PCP: Dr. Camillia Herter  82 yo with history of HTN, carotid stenosis, cerebrovascular disease, and aortic stenosis presents for cardiology followup.  He had a right CEA in 2003.  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  Stress myoview in 2010 was normal.  Echo in 10/15 showed mild aortic stenosis.   Symptomatically, he has been stable.  Low back pain continues to be his main complaint. This pain radiates into his left hip. Balance is poor.  Back pain limits his walking.  No dyspnea walking on flat ground.  No chest pain. He is short of breath when he does heavy work.  BP is high today again.  He has been taking all his BP meds. Weight is stable.   Labs (5/12): LDL 130, HDL 41, TGs 247, K 4.1, creatinine 0.9, LFTs normal Labs (12/12): LDL 119, HDL 41 Labs (4/13); TGs 249, LDL 145 Labs (11/13): K 3.8, creatinine 1.1, HDL 39, LDL 146 Labs (1/14): LDL 111 Labs (4/15): K 3.9, creatinine 1.0 Labs (6/15): LDL 102, HDL 35, TGs 282 Labs (10/15): K 3.8, creatinine 1.3 Labs (4/16): K 4, creatinine 1.12 Labs (6/16): LDL 133, HDL 34, TGs 226  ECG: NSR, normal  PMH: 1. Gout 2. HTN 3. Hyperlipidemia: myalgias with pravastatin, Crestor, simvastatin, and atorvastatin.  4. Carotid stenosis: right CEA in 2003.  Carotid dopplers (6/30) with 16-01% LICA stenosis, s/p R CEA.  Carotid dopplers (9/13) with patent right CEA, 09-32% LICA stenosis.  Carotid dopplers (4/15) with R CEA, 35-57% LICA stenosis.  Carotid dopplers (32/20) with 25-42% LICA stenosis, s/p right CEA. Carotid dopplers (7/06) with 23-76% LICA stenosis, s/p right CEA. 5. Cerebrovascular disease: Occluded basilar artery noted around 2009.  Patient had diplopia as presenting symptom (resolved).  6. Stress myoview in 2010 was normal.  7. Type II diabetes.  8. Aortic stenosis: Mild.  Echo (8/12) with EF 65-70%, mild AS with mean  gradient 14 mmHg. Echo (10/15) with EF 55-60%, moderate LVH, mild AS mean gradient 12 mmHg, AVA 1.8 cm^2.  9. H/o varicella zoster 10. 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  Retired Development worker, community.    FH: Father with MI at 93, mother with skin cancer.   ROS: All systems reviewed and negative except as per HPI.   Current Outpatient Prescriptions  Medication Sig Dispense Refill  . amLODipine (NORVASC) 10 MG tablet Take 1 tablet (10 mg total) by mouth daily. 90 tablet 3  . aspirin 81 MG tablet Take 81 mg by mouth daily.    . benazepril (LOTENSIN) 40 MG tablet Take 1 tablet by mouth  daily 90 tablet 3  . fenofibrate (TRICOR) 48 MG tablet Take 1 tablet by mouth  daily 90 tablet 0  . hydrochlorothiazide (HYDRODIURIL) 50 MG tablet Take 1 tablet (50 mg total) by mouth daily. 90 tablet 3  . metoprolol succinate (TOPROL-XL) 50 MG 24 hr tablet Take 1 tablet by mouth  daily 90 tablet 3  . ZETIA 10 MG tablet Take 1 tablet by mouth  daily 90 tablet 3  . glipiZIDE (GLUCOTROL XL) 5 MG 24 hr tablet Take 1 tablet by mouth daily. Reported on 08/05/2015     No current facility-administered medications for this visit.    BP 164/80 mmHg  Pulse 70  Ht 6' (1.829 m)  Wt 202  lb (91.627 kg)  BMI 27.39 kg/m2 General: NAD Neck: No JVD, no thyromegaly or thyroid nodule.  Lungs: Clear to auscultation bilaterally with normal respiratory effort. CV: Nondisplaced PMI.  Heart regular S1/S2, no S3/S4, 2/6 early SEM.  1+ ankle edema bilaterally.  Left carotid bruit.  Normal pedal pulses.  Abdomen: Soft, nontender, no hepatosplenomegaly, no distention.  Neurologic: Alert and oriented x 3.  Psych: Normal affect. Extremities: No clubbing or cyanosis.   Assessment/Plan: 1. Carotid stenosis: Moderate LICA stenosis. We discussed repeating carotid dopplers.  He says that he would not have any intervention on his carotids unless they were to become symptomatic (stroke).   Therefore, would not recheck carotid US at this point.     2. Hyperlipidemia: He has not been able to tolerate any statin without myalgias. He is tolerating Zetia. Lipids today.  3. HTN: BP high today.  Increase HCTZ to 50 mg daily.  Check BMET today. He will have labs in a couple of weeks with PCP, so will be getting BMET on higher HCTZ dose.  4. AS: Mild on last echo in 2015.  Exercise tolerance is a bit worse.  Will get echocardiogram to look for progression.  Followup in 6 months.    Loralie Champagne 08/06/2015

## 2015-08-12 ENCOUNTER — Ambulatory Visit (HOSPITAL_COMMUNITY): Payer: Medicare Other | Attending: Cardiovascular Disease

## 2015-08-12 ENCOUNTER — Other Ambulatory Visit: Payer: Self-pay

## 2015-08-12 DIAGNOSIS — I059 Rheumatic mitral valve disease, unspecified: Secondary | ICD-10-CM | POA: Insufficient documentation

## 2015-08-12 DIAGNOSIS — E119 Type 2 diabetes mellitus without complications: Secondary | ICD-10-CM | POA: Insufficient documentation

## 2015-08-12 DIAGNOSIS — E785 Hyperlipidemia, unspecified: Secondary | ICD-10-CM | POA: Diagnosis not present

## 2015-08-12 DIAGNOSIS — I350 Nonrheumatic aortic (valve) stenosis: Secondary | ICD-10-CM | POA: Diagnosis not present

## 2015-08-12 DIAGNOSIS — I119 Hypertensive heart disease without heart failure: Secondary | ICD-10-CM | POA: Diagnosis not present

## 2015-09-20 DIAGNOSIS — E1129 Type 2 diabetes mellitus with other diabetic kidney complication: Secondary | ICD-10-CM | POA: Diagnosis not present

## 2015-09-30 DIAGNOSIS — I119 Hypertensive heart disease without heart failure: Secondary | ICD-10-CM | POA: Diagnosis not present

## 2015-09-30 DIAGNOSIS — R809 Proteinuria, unspecified: Secondary | ICD-10-CM | POA: Diagnosis not present

## 2015-09-30 DIAGNOSIS — E1129 Type 2 diabetes mellitus with other diabetic kidney complication: Secondary | ICD-10-CM | POA: Diagnosis not present

## 2015-09-30 DIAGNOSIS — Z789 Other specified health status: Secondary | ICD-10-CM | POA: Diagnosis not present

## 2015-10-13 DIAGNOSIS — J329 Chronic sinusitis, unspecified: Secondary | ICD-10-CM | POA: Diagnosis not present

## 2015-10-13 DIAGNOSIS — J40 Bronchitis, not specified as acute or chronic: Secondary | ICD-10-CM | POA: Diagnosis not present

## 2015-11-03 ENCOUNTER — Other Ambulatory Visit: Payer: Self-pay | Admitting: Cardiology

## 2015-11-03 NOTE — Telephone Encounter (Signed)
Rx request sent to pharmacy.  

## 2016-01-27 DIAGNOSIS — E1129 Type 2 diabetes mellitus with other diabetic kidney complication: Secondary | ICD-10-CM | POA: Diagnosis not present

## 2016-02-03 DIAGNOSIS — R809 Proteinuria, unspecified: Secondary | ICD-10-CM | POA: Diagnosis not present

## 2016-02-03 DIAGNOSIS — Z23 Encounter for immunization: Secondary | ICD-10-CM | POA: Diagnosis not present

## 2016-02-03 DIAGNOSIS — E785 Hyperlipidemia, unspecified: Secondary | ICD-10-CM | POA: Diagnosis not present

## 2016-02-03 DIAGNOSIS — E1129 Type 2 diabetes mellitus with other diabetic kidney complication: Secondary | ICD-10-CM | POA: Diagnosis not present

## 2016-02-03 DIAGNOSIS — I119 Hypertensive heart disease without heart failure: Secondary | ICD-10-CM | POA: Diagnosis not present

## 2016-03-29 DIAGNOSIS — H3530 Unspecified macular degeneration: Secondary | ICD-10-CM | POA: Diagnosis not present

## 2016-03-29 DIAGNOSIS — H2512 Age-related nuclear cataract, left eye: Secondary | ICD-10-CM | POA: Diagnosis not present

## 2016-03-29 DIAGNOSIS — H2701 Aphakia, right eye: Secondary | ICD-10-CM | POA: Diagnosis not present

## 2016-05-14 DIAGNOSIS — M545 Low back pain: Secondary | ICD-10-CM | POA: Diagnosis not present

## 2016-05-14 DIAGNOSIS — E1165 Type 2 diabetes mellitus with hyperglycemia: Secondary | ICD-10-CM | POA: Diagnosis not present

## 2016-05-18 DIAGNOSIS — G8929 Other chronic pain: Secondary | ICD-10-CM | POA: Diagnosis not present

## 2016-05-18 DIAGNOSIS — Z0000 Encounter for general adult medical examination without abnormal findings: Secondary | ICD-10-CM | POA: Diagnosis not present

## 2016-05-18 DIAGNOSIS — M545 Low back pain: Secondary | ICD-10-CM | POA: Diagnosis not present

## 2016-05-26 DIAGNOSIS — M545 Low back pain: Secondary | ICD-10-CM | POA: Diagnosis not present

## 2016-05-26 DIAGNOSIS — R296 Repeated falls: Secondary | ICD-10-CM | POA: Diagnosis not present

## 2016-05-26 DIAGNOSIS — R2681 Unsteadiness on feet: Secondary | ICD-10-CM | POA: Diagnosis not present

## 2016-05-26 DIAGNOSIS — M5441 Lumbago with sciatica, right side: Secondary | ICD-10-CM | POA: Diagnosis not present

## 2016-05-26 DIAGNOSIS — R262 Difficulty in walking, not elsewhere classified: Secondary | ICD-10-CM | POA: Diagnosis not present

## 2016-05-30 DIAGNOSIS — M545 Low back pain: Secondary | ICD-10-CM | POA: Diagnosis not present

## 2016-05-30 DIAGNOSIS — M5441 Lumbago with sciatica, right side: Secondary | ICD-10-CM | POA: Diagnosis not present

## 2016-05-30 DIAGNOSIS — R2681 Unsteadiness on feet: Secondary | ICD-10-CM | POA: Diagnosis not present

## 2016-05-30 DIAGNOSIS — R296 Repeated falls: Secondary | ICD-10-CM | POA: Diagnosis not present

## 2016-05-30 DIAGNOSIS — R262 Difficulty in walking, not elsewhere classified: Secondary | ICD-10-CM | POA: Diagnosis not present

## 2016-06-02 DIAGNOSIS — M545 Low back pain: Secondary | ICD-10-CM | POA: Diagnosis not present

## 2016-06-02 DIAGNOSIS — R296 Repeated falls: Secondary | ICD-10-CM | POA: Diagnosis not present

## 2016-06-02 DIAGNOSIS — R262 Difficulty in walking, not elsewhere classified: Secondary | ICD-10-CM | POA: Diagnosis not present

## 2016-06-02 DIAGNOSIS — M5441 Lumbago with sciatica, right side: Secondary | ICD-10-CM | POA: Diagnosis not present

## 2016-06-02 DIAGNOSIS — R2681 Unsteadiness on feet: Secondary | ICD-10-CM | POA: Diagnosis not present

## 2016-06-07 DIAGNOSIS — R2681 Unsteadiness on feet: Secondary | ICD-10-CM | POA: Diagnosis not present

## 2016-06-07 DIAGNOSIS — M545 Low back pain: Secondary | ICD-10-CM | POA: Diagnosis not present

## 2016-06-07 DIAGNOSIS — R262 Difficulty in walking, not elsewhere classified: Secondary | ICD-10-CM | POA: Diagnosis not present

## 2016-06-07 DIAGNOSIS — R296 Repeated falls: Secondary | ICD-10-CM | POA: Diagnosis not present

## 2016-06-07 DIAGNOSIS — M5441 Lumbago with sciatica, right side: Secondary | ICD-10-CM | POA: Diagnosis not present

## 2016-06-09 DIAGNOSIS — M545 Low back pain: Secondary | ICD-10-CM | POA: Diagnosis not present

## 2016-06-09 DIAGNOSIS — R296 Repeated falls: Secondary | ICD-10-CM | POA: Diagnosis not present

## 2016-06-09 DIAGNOSIS — R262 Difficulty in walking, not elsewhere classified: Secondary | ICD-10-CM | POA: Diagnosis not present

## 2016-06-09 DIAGNOSIS — M5441 Lumbago with sciatica, right side: Secondary | ICD-10-CM | POA: Diagnosis not present

## 2016-06-09 DIAGNOSIS — R2681 Unsteadiness on feet: Secondary | ICD-10-CM | POA: Diagnosis not present

## 2016-06-14 DIAGNOSIS — M5441 Lumbago with sciatica, right side: Secondary | ICD-10-CM | POA: Diagnosis not present

## 2016-06-14 DIAGNOSIS — M545 Low back pain: Secondary | ICD-10-CM | POA: Diagnosis not present

## 2016-06-14 DIAGNOSIS — R2681 Unsteadiness on feet: Secondary | ICD-10-CM | POA: Diagnosis not present

## 2016-06-14 DIAGNOSIS — R262 Difficulty in walking, not elsewhere classified: Secondary | ICD-10-CM | POA: Diagnosis not present

## 2016-06-14 DIAGNOSIS — R296 Repeated falls: Secondary | ICD-10-CM | POA: Diagnosis not present

## 2016-08-02 DIAGNOSIS — E1129 Type 2 diabetes mellitus with other diabetic kidney complication: Secondary | ICD-10-CM | POA: Diagnosis not present

## 2016-08-07 DIAGNOSIS — R809 Proteinuria, unspecified: Secondary | ICD-10-CM | POA: Diagnosis not present

## 2016-08-07 DIAGNOSIS — E1129 Type 2 diabetes mellitus with other diabetic kidney complication: Secondary | ICD-10-CM | POA: Diagnosis not present

## 2016-08-07 DIAGNOSIS — E7800 Pure hypercholesterolemia, unspecified: Secondary | ICD-10-CM | POA: Diagnosis not present

## 2016-08-07 DIAGNOSIS — M479 Spondylosis, unspecified: Secondary | ICD-10-CM | POA: Diagnosis not present

## 2016-11-29 DIAGNOSIS — H2512 Age-related nuclear cataract, left eye: Secondary | ICD-10-CM | POA: Diagnosis not present

## 2016-11-29 DIAGNOSIS — H3530 Unspecified macular degeneration: Secondary | ICD-10-CM | POA: Diagnosis not present

## 2016-12-01 DIAGNOSIS — E1129 Type 2 diabetes mellitus with other diabetic kidney complication: Secondary | ICD-10-CM | POA: Diagnosis not present

## 2016-12-06 DIAGNOSIS — E1129 Type 2 diabetes mellitus with other diabetic kidney complication: Secondary | ICD-10-CM | POA: Diagnosis not present

## 2016-12-06 DIAGNOSIS — E7800 Pure hypercholesterolemia, unspecified: Secondary | ICD-10-CM | POA: Diagnosis not present

## 2016-12-06 DIAGNOSIS — R809 Proteinuria, unspecified: Secondary | ICD-10-CM | POA: Diagnosis not present

## 2016-12-06 DIAGNOSIS — M479 Spondylosis, unspecified: Secondary | ICD-10-CM | POA: Diagnosis not present

## 2017-05-30 DIAGNOSIS — E1129 Type 2 diabetes mellitus with other diabetic kidney complication: Secondary | ICD-10-CM | POA: Diagnosis not present

## 2017-06-07 DIAGNOSIS — I119 Hypertensive heart disease without heart failure: Secondary | ICD-10-CM | POA: Diagnosis not present

## 2017-06-07 DIAGNOSIS — R809 Proteinuria, unspecified: Secondary | ICD-10-CM | POA: Diagnosis not present

## 2017-06-07 DIAGNOSIS — E1129 Type 2 diabetes mellitus with other diabetic kidney complication: Secondary | ICD-10-CM | POA: Diagnosis not present

## 2017-06-07 DIAGNOSIS — Z0000 Encounter for general adult medical examination without abnormal findings: Secondary | ICD-10-CM | POA: Diagnosis not present

## 2017-06-07 DIAGNOSIS — R0602 Shortness of breath: Secondary | ICD-10-CM | POA: Diagnosis not present

## 2017-08-20 DIAGNOSIS — R809 Proteinuria, unspecified: Secondary | ICD-10-CM | POA: Diagnosis not present

## 2017-08-20 DIAGNOSIS — E7800 Pure hypercholesterolemia, unspecified: Secondary | ICD-10-CM | POA: Diagnosis not present

## 2017-08-20 DIAGNOSIS — C44629 Squamous cell carcinoma of skin of left upper limb, including shoulder: Secondary | ICD-10-CM | POA: Diagnosis not present

## 2017-08-20 DIAGNOSIS — E1129 Type 2 diabetes mellitus with other diabetic kidney complication: Secondary | ICD-10-CM | POA: Diagnosis not present

## 2017-08-20 DIAGNOSIS — F17220 Nicotine dependence, chewing tobacco, uncomplicated: Secondary | ICD-10-CM | POA: Diagnosis not present

## 2017-10-10 DIAGNOSIS — Z79899 Other long term (current) drug therapy: Secondary | ICD-10-CM | POA: Diagnosis not present

## 2017-10-10 DIAGNOSIS — L858 Other specified epidermal thickening: Secondary | ICD-10-CM | POA: Diagnosis not present

## 2017-10-10 DIAGNOSIS — Z6827 Body mass index (BMI) 27.0-27.9, adult: Secondary | ICD-10-CM | POA: Diagnosis not present

## 2017-10-10 DIAGNOSIS — L821 Other seborrheic keratosis: Secondary | ICD-10-CM | POA: Diagnosis not present

## 2017-10-10 DIAGNOSIS — M109 Gout, unspecified: Secondary | ICD-10-CM | POA: Diagnosis not present

## 2017-10-10 DIAGNOSIS — Z1331 Encounter for screening for depression: Secondary | ICD-10-CM | POA: Diagnosis not present

## 2017-10-10 DIAGNOSIS — L820 Inflamed seborrheic keratosis: Secondary | ICD-10-CM | POA: Diagnosis not present

## 2017-10-24 DIAGNOSIS — B9689 Other specified bacterial agents as the cause of diseases classified elsewhere: Secondary | ICD-10-CM | POA: Diagnosis not present

## 2017-10-24 DIAGNOSIS — L819 Disorder of pigmentation, unspecified: Secondary | ICD-10-CM | POA: Diagnosis not present

## 2017-10-24 DIAGNOSIS — J208 Acute bronchitis due to other specified organisms: Secondary | ICD-10-CM | POA: Diagnosis not present

## 2017-10-24 DIAGNOSIS — L570 Actinic keratosis: Secondary | ICD-10-CM | POA: Diagnosis not present

## 2017-10-24 DIAGNOSIS — C44622 Squamous cell carcinoma of skin of right upper limb, including shoulder: Secondary | ICD-10-CM | POA: Diagnosis not present

## 2017-10-24 DIAGNOSIS — M109 Gout, unspecified: Secondary | ICD-10-CM | POA: Diagnosis not present

## 2017-11-02 DIAGNOSIS — Z6827 Body mass index (BMI) 27.0-27.9, adult: Secondary | ICD-10-CM | POA: Diagnosis not present

## 2017-11-02 DIAGNOSIS — C44622 Squamous cell carcinoma of skin of right upper limb, including shoulder: Secondary | ICD-10-CM | POA: Diagnosis not present

## 2017-11-02 DIAGNOSIS — L0390 Cellulitis, unspecified: Secondary | ICD-10-CM | POA: Diagnosis not present

## 2017-11-02 DIAGNOSIS — L570 Actinic keratosis: Secondary | ICD-10-CM | POA: Diagnosis not present

## 2017-11-13 DIAGNOSIS — L821 Other seborrheic keratosis: Secondary | ICD-10-CM | POA: Diagnosis not present

## 2017-11-13 DIAGNOSIS — C44629 Squamous cell carcinoma of skin of left upper limb, including shoulder: Secondary | ICD-10-CM | POA: Diagnosis not present

## 2017-11-13 DIAGNOSIS — L570 Actinic keratosis: Secondary | ICD-10-CM | POA: Diagnosis not present

## 2017-11-13 DIAGNOSIS — C44622 Squamous cell carcinoma of skin of right upper limb, including shoulder: Secondary | ICD-10-CM | POA: Diagnosis not present

## 2017-11-30 DIAGNOSIS — M109 Gout, unspecified: Secondary | ICD-10-CM | POA: Diagnosis not present

## 2017-11-30 DIAGNOSIS — Z6827 Body mass index (BMI) 27.0-27.9, adult: Secondary | ICD-10-CM | POA: Diagnosis not present

## 2017-11-30 DIAGNOSIS — C44622 Squamous cell carcinoma of skin of right upper limb, including shoulder: Secondary | ICD-10-CM | POA: Diagnosis not present

## 2018-02-20 DIAGNOSIS — Z79899 Other long term (current) drug therapy: Secondary | ICD-10-CM | POA: Diagnosis not present

## 2018-02-20 DIAGNOSIS — I119 Hypertensive heart disease without heart failure: Secondary | ICD-10-CM | POA: Diagnosis not present

## 2018-02-20 DIAGNOSIS — R809 Proteinuria, unspecified: Secondary | ICD-10-CM | POA: Diagnosis not present

## 2018-02-20 DIAGNOSIS — E1129 Type 2 diabetes mellitus with other diabetic kidney complication: Secondary | ICD-10-CM | POA: Diagnosis not present

## 2018-02-20 DIAGNOSIS — E785 Hyperlipidemia, unspecified: Secondary | ICD-10-CM | POA: Diagnosis not present

## 2018-03-14 DIAGNOSIS — R531 Weakness: Secondary | ICD-10-CM | POA: Diagnosis not present

## 2018-03-14 DIAGNOSIS — R51 Headache: Secondary | ICD-10-CM | POA: Diagnosis not present

## 2018-03-14 DIAGNOSIS — C3490 Malignant neoplasm of unspecified part of unspecified bronchus or lung: Secondary | ICD-10-CM | POA: Diagnosis not present

## 2018-03-14 DIAGNOSIS — R05 Cough: Secondary | ICD-10-CM | POA: Diagnosis not present

## 2018-03-14 DIAGNOSIS — H5702 Anisocoria: Secondary | ICD-10-CM | POA: Diagnosis not present

## 2018-03-14 DIAGNOSIS — J90 Pleural effusion, not elsewhere classified: Secondary | ICD-10-CM | POA: Diagnosis not present

## 2018-03-19 DIAGNOSIS — R918 Other nonspecific abnormal finding of lung field: Secondary | ICD-10-CM | POA: Diagnosis not present

## 2018-03-20 DIAGNOSIS — J90 Pleural effusion, not elsewhere classified: Secondary | ICD-10-CM | POA: Diagnosis not present

## 2018-03-20 DIAGNOSIS — C7801 Secondary malignant neoplasm of right lung: Secondary | ICD-10-CM | POA: Diagnosis not present

## 2018-03-20 DIAGNOSIS — I4891 Unspecified atrial fibrillation: Secondary | ICD-10-CM | POA: Diagnosis not present

## 2018-03-20 DIAGNOSIS — M109 Gout, unspecified: Secondary | ICD-10-CM | POA: Diagnosis not present

## 2018-03-20 DIAGNOSIS — I509 Heart failure, unspecified: Secondary | ICD-10-CM | POA: Diagnosis not present

## 2018-03-20 DIAGNOSIS — C3492 Malignant neoplasm of unspecified part of left bronchus or lung: Secondary | ICD-10-CM | POA: Diagnosis not present

## 2018-03-20 DIAGNOSIS — I110 Hypertensive heart disease with heart failure: Secondary | ICD-10-CM | POA: Diagnosis not present

## 2018-03-20 DIAGNOSIS — I700 Atherosclerosis of aorta: Secondary | ICD-10-CM | POA: Diagnosis not present

## 2018-03-21 DIAGNOSIS — C7801 Secondary malignant neoplasm of right lung: Secondary | ICD-10-CM | POA: Diagnosis not present

## 2018-03-21 DIAGNOSIS — I700 Atherosclerosis of aorta: Secondary | ICD-10-CM | POA: Diagnosis not present

## 2018-03-21 DIAGNOSIS — I110 Hypertensive heart disease with heart failure: Secondary | ICD-10-CM | POA: Diagnosis not present

## 2018-03-21 DIAGNOSIS — C3492 Malignant neoplasm of unspecified part of left bronchus or lung: Secondary | ICD-10-CM | POA: Diagnosis not present

## 2018-03-21 DIAGNOSIS — J90 Pleural effusion, not elsewhere classified: Secondary | ICD-10-CM | POA: Diagnosis not present

## 2018-03-21 DIAGNOSIS — I4891 Unspecified atrial fibrillation: Secondary | ICD-10-CM | POA: Diagnosis not present

## 2018-03-28 DIAGNOSIS — I110 Hypertensive heart disease with heart failure: Secondary | ICD-10-CM | POA: Diagnosis not present

## 2018-03-28 DIAGNOSIS — C3492 Malignant neoplasm of unspecified part of left bronchus or lung: Secondary | ICD-10-CM | POA: Diagnosis not present

## 2018-03-28 DIAGNOSIS — J90 Pleural effusion, not elsewhere classified: Secondary | ICD-10-CM | POA: Diagnosis not present

## 2018-03-28 DIAGNOSIS — C7801 Secondary malignant neoplasm of right lung: Secondary | ICD-10-CM | POA: Diagnosis not present

## 2018-03-28 DIAGNOSIS — I4891 Unspecified atrial fibrillation: Secondary | ICD-10-CM | POA: Diagnosis not present

## 2018-03-28 DIAGNOSIS — I700 Atherosclerosis of aorta: Secondary | ICD-10-CM | POA: Diagnosis not present

## 2018-04-02 DIAGNOSIS — R51 Headache: Secondary | ICD-10-CM | POA: Diagnosis not present

## 2018-04-02 DIAGNOSIS — R110 Nausea: Secondary | ICD-10-CM | POA: Diagnosis not present

## 2018-04-02 DIAGNOSIS — R918 Other nonspecific abnormal finding of lung field: Secondary | ICD-10-CM | POA: Diagnosis not present

## 2018-04-04 DIAGNOSIS — I700 Atherosclerosis of aorta: Secondary | ICD-10-CM | POA: Diagnosis not present

## 2018-04-04 DIAGNOSIS — C7801 Secondary malignant neoplasm of right lung: Secondary | ICD-10-CM | POA: Diagnosis not present

## 2018-04-04 DIAGNOSIS — J90 Pleural effusion, not elsewhere classified: Secondary | ICD-10-CM | POA: Diagnosis not present

## 2018-04-04 DIAGNOSIS — I4891 Unspecified atrial fibrillation: Secondary | ICD-10-CM | POA: Diagnosis not present

## 2018-04-04 DIAGNOSIS — C3492 Malignant neoplasm of unspecified part of left bronchus or lung: Secondary | ICD-10-CM | POA: Diagnosis not present

## 2018-04-04 DIAGNOSIS — I110 Hypertensive heart disease with heart failure: Secondary | ICD-10-CM | POA: Diagnosis not present

## 2018-04-05 DIAGNOSIS — C7801 Secondary malignant neoplasm of right lung: Secondary | ICD-10-CM | POA: Diagnosis not present

## 2018-04-05 DIAGNOSIS — I4891 Unspecified atrial fibrillation: Secondary | ICD-10-CM | POA: Diagnosis not present

## 2018-04-05 DIAGNOSIS — C3492 Malignant neoplasm of unspecified part of left bronchus or lung: Secondary | ICD-10-CM | POA: Diagnosis not present

## 2018-04-05 DIAGNOSIS — I110 Hypertensive heart disease with heart failure: Secondary | ICD-10-CM | POA: Diagnosis not present

## 2018-04-05 DIAGNOSIS — I700 Atherosclerosis of aorta: Secondary | ICD-10-CM | POA: Diagnosis not present

## 2018-04-05 DIAGNOSIS — J90 Pleural effusion, not elsewhere classified: Secondary | ICD-10-CM | POA: Diagnosis not present

## 2018-04-07 DIAGNOSIS — J90 Pleural effusion, not elsewhere classified: Secondary | ICD-10-CM | POA: Diagnosis not present

## 2018-04-07 DIAGNOSIS — I4891 Unspecified atrial fibrillation: Secondary | ICD-10-CM | POA: Diagnosis not present

## 2018-04-07 DIAGNOSIS — I700 Atherosclerosis of aorta: Secondary | ICD-10-CM | POA: Diagnosis not present

## 2018-04-07 DIAGNOSIS — I110 Hypertensive heart disease with heart failure: Secondary | ICD-10-CM | POA: Diagnosis not present

## 2018-04-07 DIAGNOSIS — C7801 Secondary malignant neoplasm of right lung: Secondary | ICD-10-CM | POA: Diagnosis not present

## 2018-04-07 DIAGNOSIS — C3492 Malignant neoplasm of unspecified part of left bronchus or lung: Secondary | ICD-10-CM | POA: Diagnosis not present

## 2018-04-08 DIAGNOSIS — I4891 Unspecified atrial fibrillation: Secondary | ICD-10-CM | POA: Diagnosis not present

## 2018-04-08 DIAGNOSIS — C7801 Secondary malignant neoplasm of right lung: Secondary | ICD-10-CM | POA: Diagnosis not present

## 2018-04-08 DIAGNOSIS — I110 Hypertensive heart disease with heart failure: Secondary | ICD-10-CM | POA: Diagnosis not present

## 2018-04-08 DIAGNOSIS — C3492 Malignant neoplasm of unspecified part of left bronchus or lung: Secondary | ICD-10-CM | POA: Diagnosis not present

## 2018-04-08 DIAGNOSIS — J90 Pleural effusion, not elsewhere classified: Secondary | ICD-10-CM | POA: Diagnosis not present

## 2018-04-08 DIAGNOSIS — I700 Atherosclerosis of aorta: Secondary | ICD-10-CM | POA: Diagnosis not present

## 2018-04-19 DEATH — deceased
